# Patient Record
Sex: Female | Born: 1965 | Race: White | Hispanic: No | Marital: Married | Smoking: Never smoker
Health system: Southern US, Community
[De-identification: ages and names within clinical notes are randomized; demographics above are authoritative.]

## PROBLEM LIST (undated history)

## (undated) DIAGNOSIS — E785 Hyperlipidemia, unspecified: Secondary | ICD-10-CM

## (undated) DIAGNOSIS — Z87442 Personal history of urinary calculi: Secondary | ICD-10-CM

## (undated) DIAGNOSIS — Z87448 Personal history of other diseases of urinary system: Secondary | ICD-10-CM

## (undated) DIAGNOSIS — Z9889 Other specified postprocedural states: Secondary | ICD-10-CM

## (undated) DIAGNOSIS — E119 Type 2 diabetes mellitus without complications: Secondary | ICD-10-CM

## (undated) DIAGNOSIS — K219 Gastro-esophageal reflux disease without esophagitis: Secondary | ICD-10-CM

## (undated) DIAGNOSIS — G4730 Sleep apnea, unspecified: Secondary | ICD-10-CM

## (undated) DIAGNOSIS — I10 Essential (primary) hypertension: Secondary | ICD-10-CM

## (undated) DIAGNOSIS — N201 Calculus of ureter: Principal | ICD-10-CM

## (undated) HISTORY — DX: Type 2 diabetes mellitus without complications: E11.9

## (undated) HISTORY — PX: BACK SURGERY: SHX140

## (undated) HISTORY — DX: Personal history of urinary calculi: Z87.442

## (undated) HISTORY — DX: Essential (primary) hypertension: I10

## (undated) HISTORY — PX: BREAST BIOPSY: SHX20

## (undated) HISTORY — PX: LITHOTRIPSY: SUR834

## (undated) HISTORY — PX: CERVIX LESION DESTRUCTION: SHX591

## (undated) HISTORY — DX: Calculus of ureter: N20.1

## (undated) HISTORY — DX: Hyperlipidemia, unspecified: E78.5

## (undated) HISTORY — DX: Personal history of other diseases of urinary system: Z87.448

## (undated) HISTORY — DX: Sleep apnea, unspecified: G47.30

## (undated) HISTORY — DX: Gastro-esophageal reflux disease without esophagitis: K21.9

## (undated) HISTORY — DX: Other specified postprocedural states: Z98.89

## (undated) HISTORY — PX: DILATION AND CURETTAGE OF UTERUS: SHX78

---
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
## 2002-04-01 ENCOUNTER — Ambulatory Visit (HOSPITAL_COMMUNITY): Admission: RE | Admit: 2002-04-01 | Discharge: 2002-04-01 | Payer: Self-pay | Admitting: Obstetrics and Gynecology

## 2002-04-01 ENCOUNTER — Encounter (INDEPENDENT_AMBULATORY_CARE_PROVIDER_SITE_OTHER): Payer: Self-pay

## 2003-02-09 ENCOUNTER — Other Ambulatory Visit: Admission: RE | Admit: 2003-02-09 | Discharge: 2003-02-09 | Payer: Self-pay | Admitting: Obstetrics and Gynecology

## 2004-03-28 ENCOUNTER — Other Ambulatory Visit: Admission: RE | Admit: 2004-03-28 | Discharge: 2004-03-28 | Payer: Self-pay | Admitting: Obstetrics and Gynecology

## 2004-10-19 ENCOUNTER — Ambulatory Visit: Payer: Self-pay | Admitting: Internal Medicine

## 2004-11-27 ENCOUNTER — Ambulatory Visit: Payer: Self-pay | Admitting: Internal Medicine

## 2005-05-14 ENCOUNTER — Other Ambulatory Visit: Admission: RE | Admit: 2005-05-14 | Discharge: 2005-05-14 | Payer: Self-pay | Admitting: Obstetrics and Gynecology

## 2005-10-03 ENCOUNTER — Ambulatory Visit: Payer: Self-pay | Admitting: Internal Medicine

## 2006-06-26 ENCOUNTER — Ambulatory Visit: Payer: Self-pay | Admitting: Internal Medicine

## 2006-07-24 ENCOUNTER — Encounter: Admission: RE | Admit: 2006-07-24 | Discharge: 2006-07-24 | Payer: Self-pay | Admitting: Obstetrics and Gynecology

## 2006-08-05 ENCOUNTER — Encounter: Admission: RE | Admit: 2006-08-05 | Discharge: 2006-08-05 | Payer: Self-pay | Admitting: Obstetrics and Gynecology

## 2006-09-11 ENCOUNTER — Ambulatory Visit: Payer: Self-pay | Admitting: Cardiology

## 2006-09-11 ENCOUNTER — Ambulatory Visit: Payer: Self-pay | Admitting: Internal Medicine

## 2006-09-11 LAB — CONVERTED CEMR LAB
Crystals: NEGATIVE
Mucus, UA: NEGATIVE
Urine Glucose: NEGATIVE mg/dL
Urobilinogen, UA: 0.2 (ref 0.0–1.0)

## 2006-11-10 ENCOUNTER — Ambulatory Visit: Payer: Self-pay | Admitting: Internal Medicine

## 2007-01-08 ENCOUNTER — Ambulatory Visit: Payer: Self-pay | Admitting: Internal Medicine

## 2007-01-08 LAB — CONVERTED CEMR LAB
Specific Gravity, Urine: >=1.03 (ref 1.000–1.03)
pH: 6 (ref 5.0–8.0)

## 2007-06-01 ENCOUNTER — Encounter: Payer: Self-pay | Admitting: Internal Medicine

## 2007-06-01 ENCOUNTER — Ambulatory Visit: Payer: Self-pay | Admitting: Internal Medicine

## 2007-07-27 ENCOUNTER — Encounter: Admission: RE | Admit: 2007-07-27 | Discharge: 2007-07-27 | Payer: Self-pay | Admitting: Obstetrics and Gynecology

## 2007-07-27 ENCOUNTER — Telehealth: Payer: Self-pay | Admitting: Internal Medicine

## 2007-07-29 ENCOUNTER — Ambulatory Visit: Payer: Self-pay | Admitting: Internal Medicine

## 2007-07-29 DIAGNOSIS — J029 Acute pharyngitis, unspecified: Secondary | ICD-10-CM

## 2007-08-25 ENCOUNTER — Encounter: Payer: Self-pay | Admitting: *Deleted

## 2007-08-25 DIAGNOSIS — Z87442 Personal history of urinary calculi: Secondary | ICD-10-CM | POA: Insufficient documentation

## 2007-08-25 DIAGNOSIS — Z87448 Personal history of other diseases of urinary system: Secondary | ICD-10-CM

## 2007-08-25 DIAGNOSIS — Z9889 Other specified postprocedural states: Secondary | ICD-10-CM

## 2007-08-25 DIAGNOSIS — Z9189 Other specified personal risk factors, not elsewhere classified: Secondary | ICD-10-CM | POA: Insufficient documentation

## 2007-08-25 HISTORY — DX: Personal history of urinary calculi: Z87.442

## 2007-08-25 HISTORY — DX: Personal history of other diseases of urinary system: Z87.448

## 2007-08-25 HISTORY — DX: Other specified postprocedural states: Z98.89

## 2007-09-29 ENCOUNTER — Telehealth: Payer: Self-pay | Admitting: Internal Medicine

## 2007-11-15 IMAGING — US UNKNOWN US STUDY
1 series · 4 of 4 positions shown · non-contrast
Comparison: none

[REDACTED] LEFT
CC and MLO view(s) were taken of the left breast.

LEFT BREAST ULTRASOUND
DIGITAL LIMITED LEFT DIAGNOSTIC MAMMOGRAM AND LEFT BREAST ULTRASOUND:
CLINICAL DATA: The patient returns after screening study on 07-24-06 for evaluation of the left 
breast.

[Series 1: unknown us study · 4 of 4 slices shown]
[im 1/4]
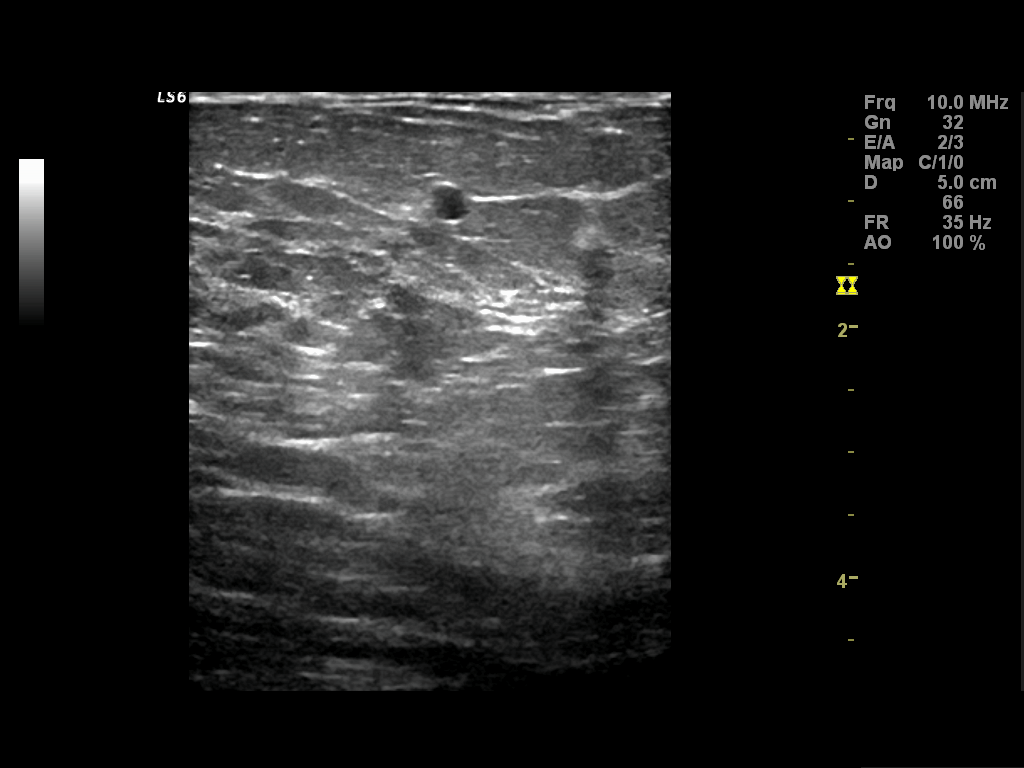
[im 2/4]
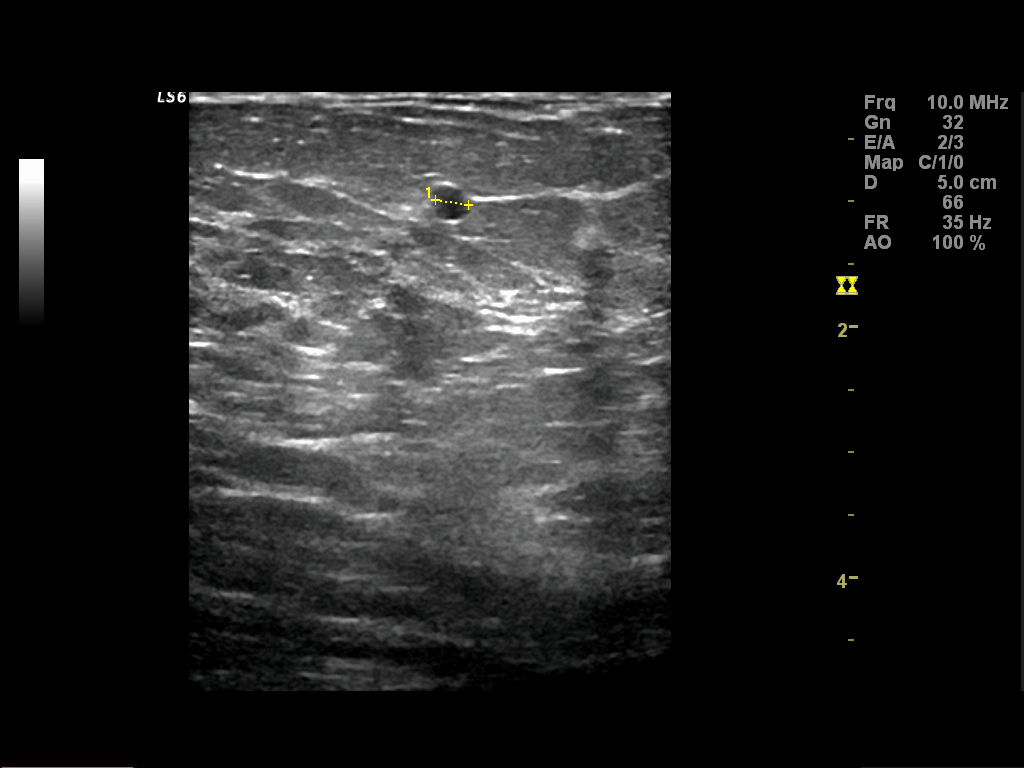
[im 3/4]
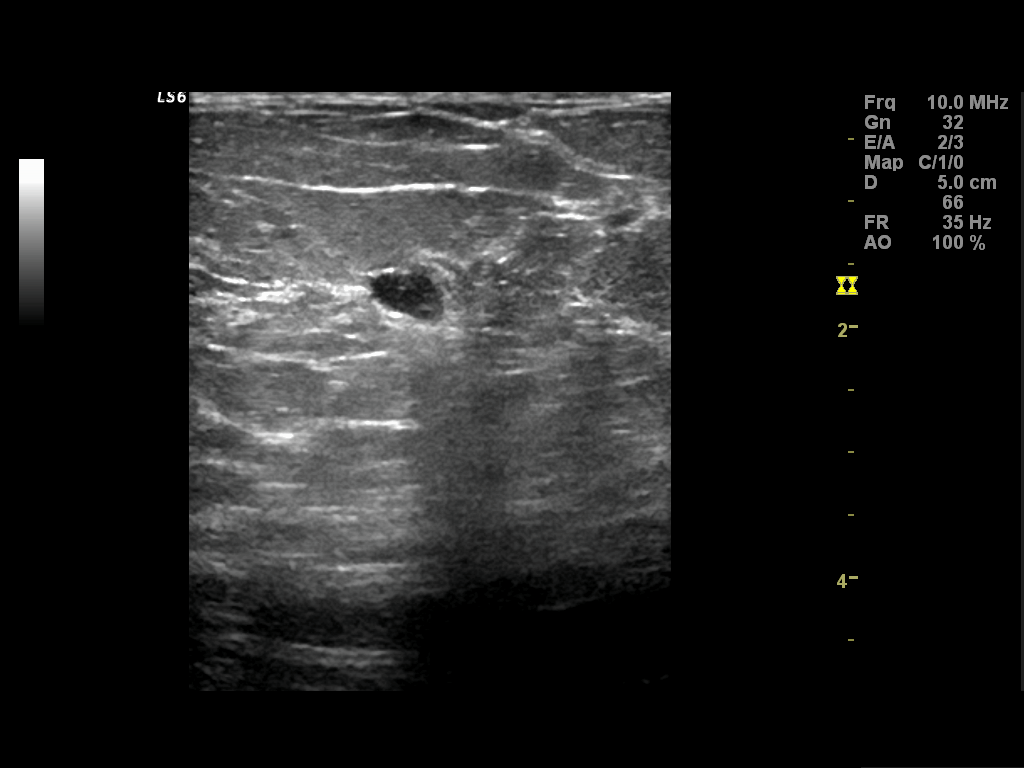
[im 4/4]
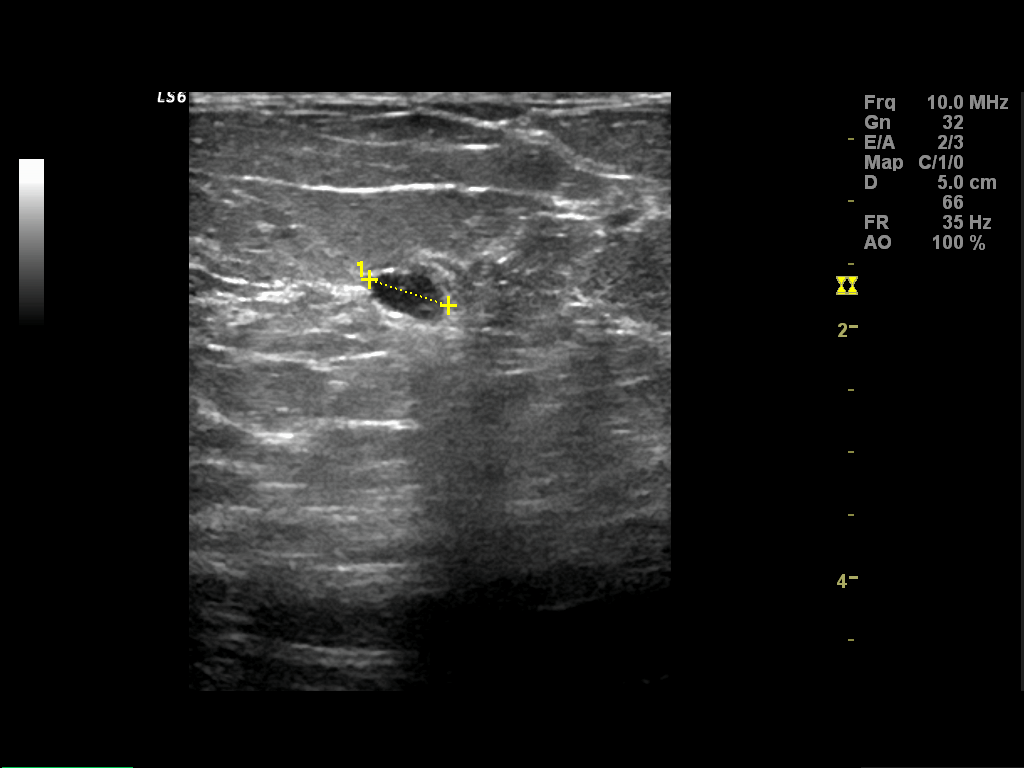

[4 of 4 positions shown; findings below may reference images not displayed]

Spot compression views of the upper portion of the left breast confirm presence of a small nodule 
which is partially obscured.  There is a moderate fibroglandular parenchymal pattern.

On physical exam, I do not palpate an abnormality in the upper quadrants of the left breast.   
Ultrasound is performed showing dense fibroglandular parenchyma.  A cyst is identified in the 12 
o'clock position of the left breast 7 cm from the nipple.  No solid mass or acoustic shadowing is 
identified to suggest the presence of malignancy.  Smaller subcentimeter cysts are also noted.
IMPRESSION: Simple cysts in the upper portion of the left breast.  No evidence for malignancy.  Annual 
mammography is recommended.

ASSESSMENT: Benign - BI-RADS 2

Screening mammogram in 1 year.
,

## 2007-12-03 ENCOUNTER — Telehealth: Payer: Self-pay | Admitting: Internal Medicine

## 2008-07-27 ENCOUNTER — Encounter: Admission: RE | Admit: 2008-07-27 | Discharge: 2008-07-27 | Payer: Self-pay | Admitting: Obstetrics and Gynecology

## 2009-05-16 ENCOUNTER — Telehealth: Payer: Self-pay | Admitting: Internal Medicine

## 2009-05-18 ENCOUNTER — Ambulatory Visit: Payer: Self-pay | Admitting: Internal Medicine

## 2009-05-18 DIAGNOSIS — R1011 Right upper quadrant pain: Secondary | ICD-10-CM

## 2009-07-31 ENCOUNTER — Encounter: Admission: RE | Admit: 2009-07-31 | Discharge: 2009-07-31 | Payer: Self-pay | Admitting: Obstetrics and Gynecology

## 2010-02-02 ENCOUNTER — Ambulatory Visit: Payer: Self-pay | Admitting: Internal Medicine

## 2010-02-02 LAB — CONVERTED CEMR LAB: Rapid Strep: NEGATIVE

## 2010-02-14 ENCOUNTER — Telehealth: Payer: Self-pay | Admitting: Internal Medicine

## 2010-08-06 ENCOUNTER — Encounter: Admission: RE | Admit: 2010-08-06 | Discharge: 2010-08-06 | Payer: Self-pay | Admitting: Obstetrics and Gynecology

## 2010-10-09 NOTE — Assessment & Plan Note (Signed)
Summary: sore throat---stc   Vital Signs:  Patient profile:   45 year old female Height:      68 inches Weight:      196 pounds BMI:     29.91 O2 Sat:      99 % on Room air Temp:     98.1 degrees F oral Pulse rate:   87 / minute BP sitting:   118 / 68  (left arm) Cuff size:   regular  Vitals Entered By: Bill Salinas CMA (Feb 02, 2010 9:38 AM)  O2 Flow:  Room air CC: pt here with c/o sore throat and head congestion x 3 days/ pt no longer taking ranitidine/ ab   Primary Care Provider:  Krizia Flight  CC:  pt here with c/o sore throat and head congestion x 3 days/ pt no longer taking ranitidine/ ab.  History of Present Illness: Patient presents for sore throat times 3 days. She has had aches, some ear congestion. She denies any fever, no N/V/D. She did see white spots on the throat this AM  Current Medications (verified): 1)  Ranitidine Hcl 150 Mg Caps (Ranitidine Hcl) .Marland Kitchen.. 1 By Mouth Two Times A Day  Allergies (verified): No Known Drug Allergies  Past History:  Past Medical History: Last updated: 08/25/2007 UTI'S, HX OF (ICD-V13.00) BREAST CANCER, FAMILY HX (ICD-V16.3) RENAL CALCULUS, HX OF (ICD-V13.01) CHICKENPOX, HX OF (ICD-V15.9) SORE THROAT (ICD-462)    Past Surgical History: Last updated: 08/25/2007 * PLACEMENT OF [place] RODS FOR SCOLIOSIS CORRECTION. 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   Review of Systems       The patient complains of fever and hoarseness.  The patient denies anorexia, weight loss, chest pain, dyspnea on exertion, prolonged cough, abdominal pain, and enlarged lymph nodes.    Physical Exam  General:  WNWD white female Head:   Normocephalic and atraumatic without obvious abnormalities. No apparent alopecia or balding. Ears:  External ear exam shows no significant lesions or deformities.  Otoscopic examination reveals clear canals, tympanic membranes are intact bilaterally without bulging, retraction, inflammation or discharge. Hearing is grossly normal bilaterally. Mouth:  tonsils with exudate and erythema Neck:  supple and full ROM.   Lungs:  normal respiratory effort and normal breath sounds.   Heart:  normal rate and regular rhythm.     Impression & Recommendations:  Problem # 1:  TONSILLITIS, ACUTE (ICD-463) tonsilits uncomplicated  Plan - augmentin 875 two times a day x 7 \         gargle of choice, APAP  Complete Medication List: 1)  Ranitidine Hcl 150 Mg Caps (Ranitidine hcl) .Marland Kitchen.. 1 by mouth two times a day 2)  Amoxicillin-pot Clavulanate 875-125 Mg Tabs (Amoxicillin-pot clavulanate) .Marland Kitchen.. 1 by mouth two times a day x 7 Prescriptions: AMOXICILLIN-POT CLAVULANATE 875-125 MG TABS (AMOXICILLIN-POT CLAVULANATE) 1 by mouth two times a day x 7  #14 x 0   Entered and Authorized by:   Jacques Navy MD   Signed by:   Bill Salinas CMA on 02/02/2010   Method used:   Electronically to        [facility]       [address]  [place], [place]  [address]       Ph: [phone]       Fax: [phone]   RxID:   [id number]   Laboratory Results   Date/Time Reported: Bill Salinas CMA  Feb 02, 2010 9:58 AM   Other Tests  Rapid Strep: negative

## 2010-10-09 NOTE — Progress Notes (Signed)
Summary: SORE THROAT  Phone Note Call from Patient Call back at Work Phone [phone] Call back at [phone]   Summary of Call: Pt continues to c/o very sore throat. She completed antibiotic. Please advise.  Initial call taken by: Lamar Sprinkles, CMA,  February 14, 2010 9:56 AM  Follow-up for Phone Call        Spoke w/pt. She completed antibiotic and white spots are gone. Throat continues to be very raw and sore, like "needles". No sinus drainage/congestion or fever. She never tried the gargling or APAP as directed at office visit. Advised pt to try gargle of choice and APAP. She will call back w/any change in symptoms.  Follow-up by: Lamar Sprinkles, CMA,  February 14, 2010 3:09 PM

## 2011-01-03 ENCOUNTER — Other Ambulatory Visit: Payer: Self-pay | Admitting: Obstetrics and Gynecology

## 2011-01-08 ENCOUNTER — Other Ambulatory Visit (INDEPENDENT_AMBULATORY_CARE_PROVIDER_SITE_OTHER): Payer: BC Managed Care – PPO

## 2011-01-08 ENCOUNTER — Ambulatory Visit (INDEPENDENT_AMBULATORY_CARE_PROVIDER_SITE_OTHER): Payer: BC Managed Care – PPO | Admitting: Internal Medicine

## 2011-01-08 ENCOUNTER — Encounter: Payer: Self-pay | Admitting: Internal Medicine

## 2011-01-08 VITALS — BP 130/80 | HR 78 | Temp 98.4°F | Ht 68.0 in | Wt 194.2 lb

## 2011-01-08 DIAGNOSIS — R109 Unspecified abdominal pain: Secondary | ICD-10-CM

## 2011-01-08 DIAGNOSIS — Z0000 Encounter for general adult medical examination without abnormal findings: Secondary | ICD-10-CM | POA: Insufficient documentation

## 2011-01-08 LAB — BASIC METABOLIC PANEL
BUN: 13 mg/dL (ref 6–23)
CO2: 28 mEq/L (ref 19–32)
Calcium: 9.2 mg/dL (ref 8.4–10.5)
Creatinine, Ser: 0.6 mg/dL (ref 0.4–1.2)
GFR: 115.6 mL/min (ref >60.00)
Glucose, Bld: 83 mg/dL (ref 70–99)
Sodium: 140 mEq/L (ref 135–145)

## 2011-01-08 LAB — CBC WITH DIFFERENTIAL/PLATELET
Eosinophils Relative: 2 % (ref 0.0–5.0)
HCT: 33.8 % — ABNORMAL LOW (ref 36.0–46.0)
Hemoglobin: 11.3 g/dL — ABNORMAL LOW (ref 12.0–15.0)
Lymphs Abs: 2.8 10*3/uL (ref 0.7–4.0)
MCV: 83.2 fl (ref 78.0–100.0)
Monocytes Absolute: 0.6 10*3/uL (ref 0.1–1.0)
Monocytes Relative: 5.4 % (ref 3.0–12.0)
Neutro Abs: 6.8 10*3/uL (ref 1.4–7.7)
Platelets: 405 10*3/uL — ABNORMAL HIGH (ref 150.0–400.0)
WBC: 10.4 10*3/uL (ref 4.5–10.5)

## 2011-01-08 LAB — URINALYSIS, ROUTINE W REFLEX MICROSCOPIC
Bilirubin Urine: NEGATIVE
Ketones, ur: NEGATIVE
Leukocytes, UA: NEGATIVE
Nitrite: NEGATIVE

## 2011-01-08 MED ORDER — CYCLOBENZAPRINE HCL 5 MG PO TABS: 5.0000 mg | ORAL_TABLET | Freq: Three times a day (TID) | ORAL | Status: DC | PRN

## 2011-01-08 MED ORDER — OXYCODONE-ACETAMINOPHEN 5-500 MG PO CAPS: 1.0000 | ORAL_CAPSULE | ORAL | Status: AC | PRN

## 2011-01-08 NOTE — Assessment & Plan Note (Signed)
By exam pt has at least moderate MSK spasm/tender which may be responsible for the pain, but pt states pain not worse with position change/urine darker  So will need UA and culture;  Currently afeb and exam benign, will hold antibx, gave limited further tylox and flexeril , but will need CT urogram and local urology eval if found to have hematuria

## 2011-01-08 NOTE — Patient Instructions (Addendum)
Take all new medications as prescribed  - the pain medication and the flexeril Continue all other medications as before Please go to LAB in the Basement for the blood and/or urine tests to be done today Please call the number on the Blue Card (the [employer] System) for results of testing in 2-3 days We will need to do CT and urology if blood shows on the UA Please see Dr Debby Bud as needed for further concerns

## 2011-01-08 NOTE — Progress Notes (Signed)
  Subjective:    Patient ID: Elizabeth Alvarado, female    DOB: 1966-07-22, 45 y.o.   MRN: [id number]  HPI  Here with left lower back and side pain onset rather sudden about 830 am, assoc with urine darker than usual,  Has some lower abd pressure, but no pain on urination, gross hematuria, urgency or frequency; pain was sharp and doubled her over this am.   No fever, chills, n/v, or LE pain/numbness/weakness.  No overt bowel change such as constipation or blood.  Pain this AM not worse or better with position change.    Has hx of 3 episodes of renal stone in the past (2 passed on her own, one by lithotrypsy), no local urologist - last seen near [place] about 10 yrs ago  Did have cervical ablation per GYN apr 26 without apparent complication;  Only took a tylox this am had from the procedure and doing ok. Father with hx of renal stone as well.   Pt denies fever, wt loss, night sweats, loss of appetite, or other constitutional symptoms  Past Medical History  Diagnosis Date  . RENAL CALCULUS, HX OF 08/25/2007  . UTI'S, HX OF 08/25/2007  . DILATION AND CURETTAGE, HX OF 08/25/2007   No past surgical history on file.  reports that she has never smoked. She does not have any smokeless tobacco history on file. Her alcohol and drug histories not on file. family history is not on file. No Known Allergies No current outpatient prescriptions on file prior to visit.   Review of Systems All otherwise neg per pt     Objective:   Physical Exam BP 130/80  Pulse 78  Temp(Src) 98.4 F (36.9 C) (Oral)  Ht 5\' 8"  (1.727 m)  Wt 194 lb 4 oz (88.111 kg)  BMI 29.54 kg/m2  SpO2 96% Physical Exam  VS noted Constitutional: Pt appears well-developed and well-nourished.  HENT: Head: Normocephalic.  Right Ear: External ear normal.  Left Ear: External ear normal.  Eyes: Conjunctivae and EOM are normal. Pupils are equal, round, and reactive to light.  Neck: Normal range of motion. Neck supple.  Cardiovascular:  Normal rate and regular rhythm.   Pulmonary/Chest: Effort normal and breath sounds normal.  Abd:  Soft, NT, non-distended, + BS Neurological: Pt is alert. No cranial nerve deficit. Motor/dtr's to LE's intact Skin: Skin is warm. No erythema.  Psychiatric: Pt behavior is normal. Thought content normal.  Does have mild to mod left flank tender and muscular spasm        Assessment & Plan:

## 2011-01-09 ENCOUNTER — Encounter: Payer: Self-pay | Admitting: Internal Medicine

## 2011-01-09 ENCOUNTER — Other Ambulatory Visit: Payer: Self-pay | Admitting: Internal Medicine

## 2011-01-09 ENCOUNTER — Ambulatory Visit (INDEPENDENT_AMBULATORY_CARE_PROVIDER_SITE_OTHER)
Admission: RE | Admit: 2011-01-09 | Discharge: 2011-01-09 | Disposition: A | Discharge status: Home / Self Care | Payer: BC Managed Care – PPO | Source: Ambulatory Visit | Attending: Cardiovascular Disease | Admitting: Cardiovascular Disease

## 2011-01-09 DIAGNOSIS — R109 Unspecified abdominal pain: Secondary | ICD-10-CM

## 2011-01-09 DIAGNOSIS — R319 Hematuria, unspecified: Secondary | ICD-10-CM

## 2011-01-09 DIAGNOSIS — N201 Calculus of ureter: Secondary | ICD-10-CM

## 2011-01-09 HISTORY — DX: Calculus of ureter: N20.1

## 2011-01-10 LAB — URINE CULTURE

## 2011-01-25 NOTE — Op Note (Signed)
[facility]  Patient:    MAILLE, HALLIWELL Visit Number: [id number] MRN: [id number]          Service Type: DSU Location: [facility] Attending Physician:  Marcelle Overlie Dictated by:   Marcelle Overlie, M.D. Proc. Date: 03/31/02 Admit Date:  04/01/2002 Discharge Date: 04/01/2002                             Operative Report  PREOPERATIVE DIAGNOSIS:       Missed abortion.  POSTOPERATIVE DIAGNOSIS:      Missed abortion.  OPERATION:                    Dilatation and evacuation.  SURGEON:                      Marcelle Overlie, M.D.  ANESTHESIA:                   MAC with paracervical block.  ESTIMATED BLOOD LOSS:         Less than 50 cc.  DESCRIPTION OF PROCEDURE:     The patient was taken to the operating room after informed consent was obtained.  She was placed in the lithotomy position.  It was noted that the patient was already bleeding after the vagina and vulva were prepped and draped in the usual sterile fashion.  In and out catheter was used to empty the bladder.  The cervix was visualized and noted to be already dilated.  A paracervical block had been performed at 5 and 7 oclock in the usual fashion.  The uterus was sounded to 8 cm.  An 8 cm suction cannula was inserted without difficulty into the uterine cavity given that the cervical os was already dilated.  Suction curettage was performed with retrieval of contents consistent with products of conception.  After the suction curettage was performed, a sharp curet was returned to the uterus, and the uterus was thoroughly curetted with very little tissue obtained with the sharp curet.  Final suction curettage was performed.  At the end of the procedure, there was no vaginal bleeding noted.  All sponge, lap, and instruments counts were correct x 2.  The patient tolerated the procedure well and went to the recovery room in stable condition.  All tissue obtained was then sent for genetic  testing. Dictated by:   Marcelle Overlie, M.D. Attending Physician:  Marcelle Overlie DD:  04/01/02 TD:  04/05/02 Job: [id number]

## 2011-02-07 ENCOUNTER — Other Ambulatory Visit: Payer: Self-pay

## 2011-02-14 ENCOUNTER — Encounter: Payer: Self-pay | Admitting: Internal Medicine

## 2011-03-11 ENCOUNTER — Other Ambulatory Visit (INDEPENDENT_AMBULATORY_CARE_PROVIDER_SITE_OTHER): Payer: BC Managed Care – PPO

## 2011-03-11 ENCOUNTER — Other Ambulatory Visit (INDEPENDENT_AMBULATORY_CARE_PROVIDER_SITE_OTHER): Payer: BC Managed Care – PPO | Admitting: Internal Medicine

## 2011-03-11 DIAGNOSIS — Z0000 Encounter for general adult medical examination without abnormal findings: Secondary | ICD-10-CM

## 2011-03-11 LAB — HEPATIC FUNCTION PANEL
ALT: 17 U/L (ref 0–35)
AST: 17 U/L (ref 0–37)
Albumin: 3.8 g/dL (ref 3.5–5.2)
Alkaline Phosphatase: 108 U/L (ref 39–117)
Total Protein: 7 g/dL (ref 6.0–8.3)

## 2011-03-11 LAB — URINALYSIS, ROUTINE W REFLEX MICROSCOPIC
Bilirubin Urine: NEGATIVE
Ketones, ur: NEGATIVE
Leukocytes, UA: NEGATIVE
Nitrite: NEGATIVE
Specific Gravity, Urine: >=1.03 (ref 1.000–1.030)
Urobilinogen, UA: 0.2 (ref 0.0–1.0)

## 2011-03-11 LAB — LIPID PANEL
Cholesterol: 174 mg/dL (ref 0–200)
Total CHOL/HDL Ratio: 4
Triglycerides: 370 mg/dL — ABNORMAL HIGH (ref 0.0–149.0)

## 2011-03-11 LAB — CBC WITH DIFFERENTIAL/PLATELET
Basophils Absolute: 0.1 10*3/uL (ref 0.0–0.1)
Eosinophils Absolute: 0.3 10*3/uL (ref 0.0–0.7)
Hemoglobin: 12.2 g/dL (ref 12.0–15.0)
Lymphocytes Relative: 24.4 % (ref 12.0–46.0)
MCHC: 33.6 g/dL (ref 30.0–36.0)
Neutro Abs: 6.1 10*3/uL (ref 1.4–7.7)
Platelets: 318 10*3/uL (ref 150.0–400.0)
RDW: 15.4 % — ABNORMAL HIGH (ref 11.5–14.6)

## 2011-03-11 LAB — BASIC METABOLIC PANEL
BUN: 14 mg/dL (ref 6–23)
Calcium: 8.6 mg/dL (ref 8.4–10.5)
Creatinine, Ser: 0.7 mg/dL (ref 0.4–1.2)
GFR: 103.48 mL/min (ref >60.00)

## 2011-03-11 LAB — TSH: TSH: 3.45 u[IU]/mL (ref 0.35–5.50)

## 2011-03-12 ENCOUNTER — Other Ambulatory Visit: Payer: Self-pay | Admitting: Internal Medicine

## 2011-03-12 DIAGNOSIS — Z0000 Encounter for general adult medical examination without abnormal findings: Secondary | ICD-10-CM

## 2011-03-18 ENCOUNTER — Encounter: Payer: Self-pay | Admitting: Internal Medicine

## 2011-03-18 ENCOUNTER — Ambulatory Visit (INDEPENDENT_AMBULATORY_CARE_PROVIDER_SITE_OTHER): Payer: BC Managed Care – PPO | Admitting: Internal Medicine

## 2011-03-18 DIAGNOSIS — Z87442 Personal history of urinary calculi: Secondary | ICD-10-CM

## 2011-03-18 DIAGNOSIS — Z0000 Encounter for general adult medical examination without abnormal findings: Secondary | ICD-10-CM

## 2011-03-18 DIAGNOSIS — M546 Pain in thoracic spine: Secondary | ICD-10-CM

## 2011-03-18 NOTE — Progress Notes (Signed)
Subjective:    Patient ID: Elizabeth Alvarado, female    DOB: 01-12-1966, 45 y.o.   MRN: [id number]  HPI Ms. Scovell presents for general physical exam. She is current with her gynecologist. Her interval history is significant for nephrolithiasis in May with passage of a 3mm stone. Otherwise she has been doing well.  She does a spot on her left brow that she is concerned about, she has a fatty tumor at the left flank. She is also have trouble with upper back pain and discomfort from her brassier strap. She does have large pendulous breasts.   Past Medical History  Diagnosis Date  . RENAL CALCULUS, HX OF 08/25/2007  . UTI'S, HX OF 08/25/2007  . DILATION AND CURETTAGE, HX OF 08/25/2007  . Ureteral stone 01/09/2011   Past Surgical History  Procedure Date  . Dilation and curettage of uterus     hx of  . Cervix lesion destruction April '12    minor abnormal PAP; "hot ballon application"   Family History  Problem Relation Age of Onset  . Cancer Mother     breast cancer, 1943  . Hypertension Mother   . Cancer Father     prostate cancer, 105  . Hyperlipidemia Father    History   Social History  . Marital Status: Married    Spouse Name: N/A    Number of Children: 1  . Years of Education: 14   Occupational History  . [employer] commercial cleaning, acctg.     [employer], [employer] acctg.   Social History Main Topics  . Smoking status: Never Smoker   . Smokeless tobacco: Never Used  . Alcohol Use: Yes     rare drink of wine or beer once a month  . Drug Use: No  . Sexually Active: Yes -- Female partner(s)   Other Topics Concern  . 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 Married '95. 1 son '01.Marriage is good. 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 Life is good      Review of Systems Review of Systems  Constitutional:  Negative for fever, chills, activity change and unexpected weight change.  HEENT:  Negative for hearing  loss, ear pain, congestion, neck stiffness and postnasal drip. Negative for sore throat or swallowing problems. Negative for dental complaints.   Eyes: Negative for vision loss or change in visual acuity.  Respiratory: Negative for chest tightness and wheezing.   Cardiovascular: Negative for chest pain and palpitation. No decreased exercise tolerance Gastrointestinal: No change in bowel habit. No bloating or gas. No reflux or indigestion Genitourinary: Negative for urgency, frequency, flank pain and difficulty urinating.  Musculoskeletal: Negative for myalgias, arthralgias and gait problem. Upper back pain and shoulder strap pain Neurological: Negative for dizziness, tremors, weakness and headaches.  Hematological: Negative for adenopathy.  Psychiatric/Behavioral: Negative for behavioral problems and dysphoric mood.       Objective:   Physical Exam Vitals reviewed Gen'l- overweight white woman in no distress HEENT- /AT, C&S clear, EACs/TMs normal, oropharynx with native dentition in good repair, posterior pharynx normal, no buccal or palatal lesions Nec - supple, no thyromegaly Chest - no cvat, no deformity Breast - deferred to Gyn but pendulous Lungs - clear with normal breath sounds Heart - regular rate and rhythm, no murmurs. 2+ radial and DP pulses Abdomen - overweight, BS +, soft, No [facility] Gyn - deferred Extremities - no deformity Neuro- A&O x 3, CN  II-XII normal, MS normal, gait normal Derm- 5 cm fatty tumor left flank at axillary line L1 level; small mole right forehead - smooth border.  Lab Results  Component Value Date   WBC 9.2 03/11/2011   HGB 12.2 03/11/2011   HCT 36.4 03/11/2011   PLT 318.0 03/11/2011   CHOL 174 03/11/2011   TRIG 370.0* 03/11/2011   HDL 44.80 03/11/2011   LDLDIRECT 94.0 03/11/2011   ALT 17 03/11/2011   AST 17 03/11/2011   NA 138 03/11/2011   K 3.8 03/11/2011   CL 107 03/11/2011   CREATININE 0.7 03/11/2011   BUN 14 03/11/2011   CO2 26 03/11/2011   TSH 3.45 03/11/2011            Assessment & Plan:

## 2011-03-19 DIAGNOSIS — M546 Pain in thoracic spine: Secondary | ICD-10-CM | POA: Insufficient documentation

## 2011-03-19 NOTE — Assessment & Plan Note (Signed)
Interval history remarkable for 4th kidney stone otherwise OK. Physical exam, sans breast and pelvic, is normal. Lab results are in normal limits except for moderately elevated triglycerides - correctable by low fat diet. She is current with her gynecologist and she has had mammography.   In summary - a very nice woman who appears to be medically stable. Should she decide to pursue reduction mammoplasty she has not surgical contra-indications.  She is advised to return for a general wellness exam in 2-3 years as long as she remains current with her gynecologist.

## 2011-03-19 NOTE — Assessment & Plan Note (Signed)
Recent episode of nephrolithiasis - 3 mm stone on CT protocol. Evidently passed spontaneously  Plan - acidify urine for prevention of calcium oxalate stones - daily lemon juice and water.

## 2011-03-19 NOTE — Assessment & Plan Note (Signed)
Patient with annoying and progressive upper back pain and pain from straps of her brassiere. She has very large pendulous breasts which are the likely cause of discomfort.  Plan - she may want to consider reduction mammoplasty for progressive back pain.

## 2011-07-03 ENCOUNTER — Other Ambulatory Visit: Payer: Self-pay | Admitting: Obstetrics and Gynecology

## 2011-07-03 DIAGNOSIS — Z1231 Encounter for screening mammogram for malignant neoplasm of breast: Secondary | ICD-10-CM

## 2011-08-08 ENCOUNTER — Ambulatory Visit
Admission: RE | Admit: 2011-08-08 | Discharge: 2011-08-08 | Disposition: A | Discharge status: Home / Self Care | Payer: BC Managed Care – PPO | Source: Ambulatory Visit | Attending: Obstetrics and Gynecology | Admitting: Obstetrics and Gynecology

## 2011-08-08 DIAGNOSIS — Z1231 Encounter for screening mammogram for malignant neoplasm of breast: Secondary | ICD-10-CM

## 2011-09-26 ENCOUNTER — Emergency Department (HOSPITAL_COMMUNITY)
Admission: EM | Admit: 2011-09-26 | Discharge: 2011-09-26 | Disposition: A | Discharge status: Home / Self Care | Payer: No Typology Code available for payment source | Attending: Emergency Medicine | Admitting: Emergency Medicine

## 2011-09-26 ENCOUNTER — Encounter (HOSPITAL_COMMUNITY): Payer: Self-pay | Admitting: *Deleted

## 2011-09-26 DIAGNOSIS — S0191XA Laceration without foreign body of unspecified part of head, initial encounter: Secondary | ICD-10-CM

## 2011-09-26 DIAGNOSIS — W540XXA Bitten by dog, initial encounter: Secondary | ICD-10-CM | POA: Insufficient documentation

## 2011-09-26 DIAGNOSIS — S0100XA Unspecified open wound of scalp, initial encounter: Secondary | ICD-10-CM | POA: Insufficient documentation

## 2011-09-26 MED ORDER — AMOXICILLIN-POT CLAVULANATE 875-125 MG PO TABS: 1.0000 | ORAL_TABLET | Freq: Two times a day (BID) | ORAL | Status: AC

## 2011-09-26 MED ORDER — TETANUS-DIPHTH-ACELL PERTUSSIS 5-2.5-18.5 LF-MCG/0.5 IM SUSP
0.5000 mL | Freq: Once | INTRAMUSCULAR | Status: AC
  Administered 2011-09-26: 0.5 mL via INTRAMUSCULAR
  Filled 2011-09-26: qty 0.5

## 2011-09-26 MED ORDER — TRAMADOL HCL 50 MG PO TABS: 50.0000 mg | ORAL_TABLET | Freq: Four times a day (QID) | ORAL | Status: AC | PRN

## 2011-09-26 MED ORDER — TRAMADOL HCL 50 MG PO TABS: 50.0000 mg | ORAL_TABLET | Freq: Four times a day (QID) | ORAL | Status: DC | PRN

## 2011-09-26 NOTE — ED Notes (Signed)
Pt in good condition, in no acute stress, wound dressed

## 2011-09-26 NOTE — ED Provider Notes (Signed)
History     CSN: [id number]  Arrival date & time 09/26/11  2025   First MD Initiated Contact with Patient 09/26/11 2117      Chief Complaint  Patient presents with  . Animal Bite    family dog, h/o same    (Consider location/radiation/quality/duration/timing/severity/associated sxs/prior treatment) Patient is a 46 y.o. female presenting with animal bite. The history is provided by the patient.  Animal Bite  The incident occurred just prior to arrival. There is an injury to the face. The pain is mild. It is unlikely that a foreign body is present. Pertinent negatives include no visual disturbance, no nausea, no vomiting, no headaches, no neck pain, no focal weakness, no light-headedness, no loss of consciousness, no tingling and no weakness.    Past Medical History  Diagnosis Date  . RENAL CALCULUS, HX OF 08/25/2007  . UTI'S, HX OF 08/25/2007  . DILATION AND CURETTAGE, HX OF 08/25/2007  . Ureteral stone 01/09/2011    Past Surgical History  Procedure Date  . Dilation and curettage of uterus     hx of  . Cervix lesion destruction April '12    minor abnormal PAP; "hot ballon application"  . Back surgery   . Lithotripsy     Family History  Problem Relation Age of Onset  . Cancer Mother     breast cancer, 1943  . Hypertension Mother   . Cancer Father     prostate cancer, 51  . Hyperlipidemia Father     History  Substance Use Topics  . Smoking status: Never Smoker   . Smokeless tobacco: Never Used  . Alcohol Use: Yes     rare drink of wine or beer once a month    OB History    Grav Para Term Preterm Abortions TAB SAB Ect Mult Living                  Review of Systems  HENT: Negative for neck pain and neck stiffness.   Eyes: Negative for pain and visual disturbance.  Gastrointestinal: Negative for nausea and vomiting.  Skin: Positive for wound.  Neurological: Negative for tingling, focal weakness, loss of consciousness, weakness, light-headedness and  headaches.    Allergies  Review of patient's allergies indicates no known allergies.  Home Medications   Current Outpatient Rx  Name Route Sig Dispense Refill  . AMOXICILLIN-POT CLAVULANATE 875-125 MG PO TABS Oral Take 1 tablet by mouth every 12 (twelve) hours. 14 tablet 0  . TRAMADOL HCL 50 MG PO TABS Oral Take 1 tablet (50 mg total) by mouth every 6 (six) hours as needed for pain. 15 tablet 0    BP 144/93  Temp 98 F (36.7 C)  Resp 18  Wt 155 lb (70.308 kg)  SpO2 100%  LMP 09/17/2011  Physical Exam  Constitutional: She is oriented to person, place, and time. She appears well-developed and well-nourished. No distress.  HENT:  Head: Normocephalic.       2 lacerations ( 2cm and 3 cm ) to L temporal region extending to subcutaneous. Minimal oozing. Eye unaffected. No evidence of trauma, PERRL, EOMI  Eyes: Conjunctivae and EOM are normal. Pupils are equal, round, and reactive to light.  Neck: Normal range of motion. Neck supple.       No cervical midline tenderness  Cardiovascular: Normal rate and regular rhythm.   Pulmonary/Chest: Effort normal and breath sounds normal. No respiratory distress. She has no wheezes. She has no rales. She exhibits no tenderness.  Abdominal: Soft. Bowel sounds are normal. She exhibits no mass. There is no tenderness. There is no rebound.  Musculoskeletal: Normal range of motion.  Neurological: She is alert and oriented to person, place, and time.  Skin: No rash noted. No erythema. No pallor.    ED Course  Procedures (including critical care time)  Labs Reviewed - No data to display No results found.   1. Dog bite of face   2. Laceration of face, complex    LACERATION REPAIR Performed by: Loren Racer Authorized by: Ranae Palms, Amro Winebarger Consent: Verbal consent obtained. Risks and benefits: risks, benefits and alternatives were discussed Consent given by: patient Patient identity confirmed: provided demographic data Prepped and Draped  in normal sterile fashion Wound explored  Laceration Location: L temporal region Laceration Length: 5 cm  No Foreign Bodies seen or palpated  Anesthesia: local infiltration  Local anesthetic: lidocaine 2% with epinephrine  Anesthetic total: 5 ml  Irrigation method: syringe Amount of cleaning: standard  Skin closure: complex  Number of sutures: 5-0 Vicryl (2), 6-0 nylon(8)  Technique: 2 dermal sutures placed to approximate skin edges and then closed with simple interrupted sutures   Patient tolerance: Patient tolerated the procedure well with no immediate complications.   MDM  Good cosmetic outcome. F/u with PMD or in ED to have sutures removed in 5 days.         Loren Racer, MD 09/26/11 2242

## 2011-09-26 NOTE — ED Notes (Signed)
Patient with laceration at lateral corner of left eye.  Laceration is approx 1 in long, bleeding controlled at this time.

## 2011-09-26 NOTE — ED Notes (Signed)
Pt here s/p family dog bite, bite to L lateral eye. Bleeding controlled, redness & swelling present, no obvious bruising. Pt tearful. H/o dog bite with same dog & other member of family. "dog's shots are UTD", pt's Td unknown, >5 yrs (not sure). Rates pain 6/10. Dizzy initially, denies dizziness or nausea at this time, denies other sx. Bite/wound ~ 1".

## 2012-01-30 ENCOUNTER — Other Ambulatory Visit: Payer: Self-pay | Admitting: Obstetrics and Gynecology

## 2012-03-18 ENCOUNTER — Encounter: Payer: BC Managed Care – PPO | Admitting: Internal Medicine

## 2012-05-20 ENCOUNTER — Other Ambulatory Visit (INDEPENDENT_AMBULATORY_CARE_PROVIDER_SITE_OTHER): Payer: No Typology Code available for payment source

## 2012-05-20 ENCOUNTER — Ambulatory Visit (INDEPENDENT_AMBULATORY_CARE_PROVIDER_SITE_OTHER): Payer: No Typology Code available for payment source | Admitting: Internal Medicine

## 2012-05-20 ENCOUNTER — Encounter: Payer: Self-pay | Admitting: Internal Medicine

## 2012-05-20 VITALS — BP 124/84 | HR 90 | Temp 98.0°F | Resp 16 | Wt 173.0 lb

## 2012-05-20 DIAGNOSIS — Z23 Encounter for immunization: Secondary | ICD-10-CM

## 2012-05-20 DIAGNOSIS — E7849 Other hyperlipidemia: Secondary | ICD-10-CM | POA: Insufficient documentation

## 2012-05-20 DIAGNOSIS — E1169 Type 2 diabetes mellitus with other specified complication: Secondary | ICD-10-CM | POA: Insufficient documentation

## 2012-05-20 DIAGNOSIS — Z87448 Personal history of other diseases of urinary system: Secondary | ICD-10-CM

## 2012-05-20 DIAGNOSIS — E781 Pure hyperglyceridemia: Secondary | ICD-10-CM

## 2012-05-20 DIAGNOSIS — Z0000 Encounter for general adult medical examination without abnormal findings: Secondary | ICD-10-CM

## 2012-05-20 NOTE — Progress Notes (Signed)
Subjective:     Patient ID: Elizabeth Alvarado, female   DOB: Jul 22, 1966, 46 y.o.   MRN: [id number]  HPI Comments: Elizabeth Alvarado is a 46 yo female with a history of scoliosis who has come to clinic today for an annual exam. She reports no changes in her health or illness in the last year. She exercises three times a week at a gym. She reports that she tans and bikes outside. She wears SPF 15 sunscreen that protects against UVA and UVB rays and wears a hat. She has stopped drinking soft drinks and now drinks mostly unsweet tea and water. She reports that she has strayed from her diet and gained weight. She sees a gynecologist regularly for health maintenance. She has no complaints at this time.  Past Medical History  Diagnosis Date  . RENAL CALCULUS, HX OF 08/25/2007  . UTI'S, HX OF 08/25/2007  . DILATION AND CURETTAGE, HX OF 08/25/2007  . Ureteral stone 01/09/2011   Past Surgical History  Procedure Date  . Dilation and curettage of uterus     hx of  . Cervix lesion destruction April '12    minor abnormal PAP; "hot ballon application"  . Back surgery   . Lithotripsy    Family History  Problem Relation Age of Onset  . Cancer Mother     breast cancer, 1943  . Hypertension Mother   . Cancer Father     prostate cancer, 38  . Hyperlipidemia Father    History   Social History  . Marital Status: Married    Spouse Name: N/A    Number of Children: 1  . Years of Education: 14   Occupational History  . [employer], ac[employer]g.     HS[employer], [employer].   Social History Main Topics  . Smoking status: Never Smoker   . Smokeless tobacco: Never Used  . Alcohol Use: 1.0 oz/week    2 drink(s) per week     rare drink of wine or beer once a month  . Drug Use: No  . Sexually Active: Yes -- Female partner(s)     Monogamous relationship with husband.   Other Topics Concern  . 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 Married '95. 1  son '01.Marriage is good. 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 Life is good    No current outpatient prescriptions on file prior to visit.    Review of Systems  Constitutional: Negative for fever, chills, appetite change, fatigue and unexpected weight change.  HENT: Negative for hearing loss, congestion, facial swelling, trouble swallowing and tinnitus.   Eyes: Negative for photophobia and visual disturbance.  Respiratory: Negative for chest tightness, shortness of breath and wheezing.   Cardiovascular: Negative for chest pain, palpitations and leg swelling.  Gastrointestinal: Negative for abdominal pain and constipation.  Genitourinary: Negative for dysuria, frequency, decreased urine volume and difficulty urinating.  Musculoskeletal: Negative for back pain and joint swelling.  Neurological: Negative for weakness, numbness and headaches.       Objective:   Physical Exam  Constitutional: She is oriented to person, place, and time. She appears well-developed and well-nourished.       Obese  HENT:  Head: Normocephalic and atraumatic.  Right Ear: External ear normal.  Left Ear: External ear normal.  Nose: Nose normal.  Mouth/Throat: Oropharynx is clear and moist. No oropharyngeal exudate.       TM visualized in both  ears and normal. External canals clear of debris.   Eyes: Conjunctivae normal are normal. Pupils are equal, round, and reactive to light. Right eye exhibits no discharge. Left eye exhibits no discharge. No scleral icterus.  Neck: Normal range of motion. Neck supple. No thyromegaly present.  Cardiovascular: Normal rate, regular rhythm, normal heart sounds and intact distal pulses.  Exam reveals no gallop and no friction rub.   No murmur heard.      No carotid bruits auscultated. 2+ Carotid pulses bl. 2+ Radial pulses bl. 1+ dorsal pedalis pulses bl.  Pulmonary/Chest: Breath sounds normal. No respiratory distress. She has no wheezes. She has no rales.       Equal  chest expansion  Abdominal: Soft. Bowel sounds are normal. She exhibits no distension and no mass. There is no tenderness (no tenderness to light or deep palpation). There is no guarding.       Lower liver boarder at right costal margin. No abdominal bruits auscultated.  Musculoskeletal: She exhibits no edema and no tenderness.  Lymphadenopathy:    She has cervical adenopathy (remote swollen lymph node on left side of neck.).  Neurological: She is alert and oriented to person, place, and time. She exhibits normal muscle tone.  Skin: Skin is warm.  Psychiatric: She has a normal mood and affect. Her behavior is normal. Judgment and thought content normal.   Filed Vitals:   05/20/12 1428  BP: 124/84  Pulse: 90  Temp: 98 F (36.7 C)  Resp: 16       Assessment & Plan:     1. Annual Exam - Patient is in good health with no concerns. Triglycerides were elevated 1 year ago. Patient will schedule her own mammography with an established clinic. She had a normal PAP smear this year.  PLAN - Obtain triglyceride lab. No interventions needed at this time.     Attending note: patient interviewed and a limited exam performed. Agree with the assessment and plan of Mr. Kai Levins, MS III.

## 2012-05-20 NOTE — Assessment & Plan Note (Signed)
Patient reports that she has not had any UTIs in the last year.  PLAN - Nothing to do. Inquire about UTI on next exam.

## 2012-05-22 ENCOUNTER — Encounter: Payer: Self-pay | Admitting: Internal Medicine

## 2012-05-22 ENCOUNTER — Other Ambulatory Visit: Payer: Self-pay | Admitting: Internal Medicine

## 2012-05-22 DIAGNOSIS — E781 Pure hyperglyceridemia: Secondary | ICD-10-CM

## 2012-05-23 NOTE — Assessment & Plan Note (Signed)
Interval medical history is benign. Physical exam, sans breast and pelvic, is normal. She is current with her gynecologist for cervical and breast cancer screening. Tetanus immunization is up to date. She does exercise on a regular basis.  In summary - a nice woman who is medically stable and doing well. She is advised to return for a routine medical exam in 2-3 years. She should continue to see her gynecologist as instructed.

## 2012-05-23 NOTE — Assessment & Plan Note (Signed)
July '12 Sept '13 Triglycerides   370   259  No need for medical intervention. Low fat, low carb diet recommended.

## 2012-06-29 ENCOUNTER — Other Ambulatory Visit: Payer: Self-pay | Admitting: Obstetrics and Gynecology

## 2012-06-29 DIAGNOSIS — Z1231 Encounter for screening mammogram for malignant neoplasm of breast: Secondary | ICD-10-CM

## 2012-08-10 ENCOUNTER — Ambulatory Visit: Payer: No Typology Code available for payment source

## 2012-08-17 ENCOUNTER — Ambulatory Visit
Admission: RE | Admit: 2012-08-17 | Discharge: 2012-08-17 | Disposition: A | Discharge status: Home / Self Care | Payer: No Typology Code available for payment source | Source: Ambulatory Visit | Attending: Obstetrics and Gynecology | Admitting: Obstetrics and Gynecology

## 2012-08-17 DIAGNOSIS — Z1231 Encounter for screening mammogram for malignant neoplasm of breast: Secondary | ICD-10-CM

## 2012-08-20 ENCOUNTER — Other Ambulatory Visit: Payer: Self-pay | Admitting: Obstetrics and Gynecology

## 2012-08-20 DIAGNOSIS — R928 Other abnormal and inconclusive findings on diagnostic imaging of breast: Secondary | ICD-10-CM

## 2012-09-03 ENCOUNTER — Ambulatory Visit
Admission: RE | Admit: 2012-09-03 | Discharge: 2012-09-03 | Disposition: A | Discharge status: Home / Self Care | Payer: No Typology Code available for payment source | Source: Ambulatory Visit | Attending: Obstetrics and Gynecology | Admitting: Obstetrics and Gynecology

## 2012-09-03 ENCOUNTER — Other Ambulatory Visit: Payer: Self-pay | Admitting: Obstetrics and Gynecology

## 2012-09-03 DIAGNOSIS — R928 Other abnormal and inconclusive findings on diagnostic imaging of breast: Secondary | ICD-10-CM

## 2012-09-14 ENCOUNTER — Ambulatory Visit
Admission: RE | Admit: 2012-09-14 | Discharge: 2012-09-14 | Disposition: A | Discharge status: Home / Self Care | Payer: No Typology Code available for payment source | Source: Ambulatory Visit | Attending: Obstetrics and Gynecology | Admitting: Obstetrics and Gynecology

## 2012-09-14 ENCOUNTER — Ambulatory Visit
Admission: RE | Admit: 2012-09-14 | Discharge: 2012-09-14 | Disposition: A | Discharge status: Home / Self Care | Payer: No Typology Code available for payment source | Source: Ambulatory Visit

## 2012-09-14 ENCOUNTER — Other Ambulatory Visit: Payer: Self-pay | Admitting: Obstetrics and Gynecology

## 2012-09-14 DIAGNOSIS — R928 Other abnormal and inconclusive findings on diagnostic imaging of breast: Secondary | ICD-10-CM

## 2012-09-15 ENCOUNTER — Other Ambulatory Visit: Payer: Self-pay | Admitting: Obstetrics and Gynecology

## 2012-09-15 ENCOUNTER — Inpatient Hospital Stay: Admission: RE | Admit: 2012-09-15 | Payer: No Typology Code available for payment source | Source: Ambulatory Visit

## 2012-09-15 DIAGNOSIS — N63 Unspecified lump in unspecified breast: Secondary | ICD-10-CM

## 2012-09-16 ENCOUNTER — Other Ambulatory Visit: Payer: Self-pay | Admitting: Obstetrics and Gynecology

## 2012-09-16 DIAGNOSIS — R928 Other abnormal and inconclusive findings on diagnostic imaging of breast: Secondary | ICD-10-CM

## 2013-01-05 ENCOUNTER — Telehealth: Payer: Self-pay | Admitting: Internal Medicine

## 2013-01-05 NOTE — Telephone Encounter (Signed)
Patient Information:  Caller Name: Zafiro  Phone: [phone]  Patient: Larina Bras  Gender: Female  DOB: Jan 09, 1966  Age: 47 Years  PCP: Illene Regulus (Adults only)  Pregnant: No  Office Follow Up:  Does the office need to follow up with this patient?: Yes  Instructions For The Office: Patient will look at her schedule and call back if she can for an appt.   Declined appt at this time   Symptoms  Reason For Call & Symptoms: "I think I have UTI", requesting antibiotic. Onset this morning, +burning, +urgency and pressure. No blood in urine , no odor.  Last UOP- ago.  Reviewed Health History In EMR: Yes  Reviewed Medications In EMR: Yes  Reviewed Allergies In EMR: Yes  Reviewed Surgeries / Procedures: Yes  Date of Onset of Symptoms: 01/05/2013  Treatments Tried: water, cranberry supplements, Azo  Treatments Tried Worked: No OB / GYN:  LMP: 12/12/2012  Guideline(s) Used:  Urination Pain - Female  Disposition Per Guideline:   Go to Office Now  Reason For Disposition Reached:   Side (flank) or lower back pain present  Advice Given:  Fluids:   Drink extra fluids. Drink 8-10 glasses of liquids a day (Reason: to produce a dilute, non-irritating urine).  Warm Saline SITZ Baths to Reduce Pain:  Sit in a warm saline bath for 20 minutes to cleanse the area and to reduce pain. Add 2 oz. of table salt or baking soda to a tub of water.  Call Back If:  You become worse.  Patient Refused Recommendation:  Patient Requests Prescription  Patient will look at her schedule and call back if she can for an appt.   Declined appt at this time

## 2013-03-10 ENCOUNTER — Other Ambulatory Visit: Payer: Self-pay | Admitting: Obstetrics and Gynecology

## 2013-03-30 ENCOUNTER — Other Ambulatory Visit: Payer: Self-pay | Admitting: Obstetrics and Gynecology

## 2013-08-09 ENCOUNTER — Other Ambulatory Visit: Payer: Self-pay

## 2013-08-09 DIAGNOSIS — Z1231 Encounter for screening mammogram for malignant neoplasm of breast: Secondary | ICD-10-CM

## 2013-08-30 ENCOUNTER — Other Ambulatory Visit: Payer: Self-pay | Admitting: Obstetrics and Gynecology

## 2013-09-15 ENCOUNTER — Ambulatory Visit
Admission: RE | Admit: 2013-09-15 | Discharge: 2013-09-15 | Disposition: A | Discharge status: Home / Self Care | Payer: BC Managed Care – PPO | Source: Ambulatory Visit

## 2013-09-15 DIAGNOSIS — Z1231 Encounter for screening mammogram for malignant neoplasm of breast: Secondary | ICD-10-CM

## 2014-02-28 ENCOUNTER — Telehealth: Payer: Self-pay | Admitting: Internal Medicine

## 2014-02-28 NOTE — Telephone Encounter (Signed)
Patient aware that she will need to go to Urgent care in order to get any RX medications at this time.  I tried to offer OV for tomorrow but our schedule is booked.  Patient understood and will go to urgent care.

## 2014-02-28 NOTE — Telephone Encounter (Signed)
Patient Information:  Caller Name: [name]  Phone: [phone]  Patient: Elizabeth Alvarado, Elizabeth Alvarado  Gender: Female  DOB: 07/08/1966  Age: 48 Years  PCP: Maximino SarinWeaver, Layne  Pregnant: No  Office Follow Up:  Does the office need to follow up with this patient?: Yes  Instructions For The Office: She is at work and will go to an Urgent Care if office will not call her in medication for Poision Lajoyce Cornersvy that is not responding to any OTC treatment. Her Dr. that just retired from [facility] office used to call her in medications, she has not been seen in this office.  RN Note:  Afebrile. After working in the yard, poison ivy contact and widespread, red, itchy rash: legs, arms, stomach, back between legs and not responding to any home care treatments mentioned above. She was a patient of Christella NoaMichael Norris, who is now retired as of May 2015 and would like prescription called in, if not will be seen in the office. She prefers to use the Pharmacy: [facility] [address] [place] [phone]. Please call today, 02/28/2014 and let her know if something will be called in or if she needs to go to an Urgent Care.  Symptoms  Reason For Call & Symptoms: Working in the yard and got poision ivy" widespread  Reviewed Health History In EMR: Yes  Reviewed Medications In EMR: Yes  Reviewed Allergies In EMR: Yes  Reviewed Surgeries / Procedures: Yes  Date of Onset of Symptoms: 02/14/2014  Treatments Tried: Otc: Calamine lotion, Vinegar, Bleach, OH, Benadryl  Treatments Tried Worked: No OB / GYN:  LMP: 02/28/2014  Guideline(s) Used:  Poison Ivy - Oak or [place]  Disposition Per Guideline:   See Today in Office  Reason For Disposition Reached:   Severe itching interferes with normal activities (e.Alvarado., work or school) or prevents sleep  Advice Given:  Apply Cold to the Area:  Soak the involved area in cool water for 20 minutes or massage it with an ice cube as often as necessary to reduce itching and oozing.  Oral  Antihistamine Medication for Itching:   Do not take antihistamine medications if you have prostate problems.  Call Back If:  It looks infected  You become worse.  Patient Will Follow Care Advice:  YES

## 2014-05-23 ENCOUNTER — Other Ambulatory Visit: Payer: Self-pay | Admitting: Obstetrics and Gynecology

## 2014-05-24 LAB — CYTOLOGY - PAP

## 2014-05-30 ENCOUNTER — Ambulatory Visit (INDEPENDENT_AMBULATORY_CARE_PROVIDER_SITE_OTHER): Payer: BC Managed Care – PPO | Admitting: Family Medicine

## 2014-05-30 ENCOUNTER — Encounter: Payer: Self-pay | Admitting: Family Medicine

## 2014-05-30 VITALS — BP 124/82 | HR 83 | Temp 98.4°F | Ht 67.0 in | Wt 198.5 lb

## 2014-05-30 DIAGNOSIS — R600 Localized edema: Secondary | ICD-10-CM

## 2014-05-30 DIAGNOSIS — R609 Edema, unspecified: Secondary | ICD-10-CM

## 2014-05-30 DIAGNOSIS — A46 Erysipelas: Secondary | ICD-10-CM

## 2014-05-30 MED ORDER — DOXYCYCLINE HYCLATE 100 MG PO TABS: 100.0000 mg | ORAL_TABLET | Freq: Two times a day (BID) | ORAL | Status: DC

## 2014-05-30 MED ORDER — AMOXICILLIN 875 MG PO TABS: 875.0000 mg | ORAL_TABLET | Freq: Two times a day (BID) | ORAL | Status: DC

## 2014-05-30 NOTE — Patient Instructions (Signed)
-  As we discussed, we have prescribed a new medication for you at this appointment. We discussed the common and serious potential adverse effects of this medication and you can review these and more with the pharmacist when you pick up your medication.  Please follow the instructions for use carefully and notify us immediately if you have any problems taking this medication.  -elevate feet for 30 minutes twice daily

## 2014-05-30 NOTE — Progress Notes (Signed)
No chief complaint on file.   HPI:  Acute visit for:  1) Feet swollen: -started today -went to the race in [place] yesterday and ate biscuits ,then lunch at the track -denies: CP, SOB, DOE, Orthopnea, fevers, chills, malaise, palpitations -has been on her feet more then usually  -PCP Dr. Myrtie Hawk, but has not seen yet  ROS: See pertinent positives and negatives per HPI.  Past Medical History  Diagnosis Date  . RENAL CALCULUS, HX OF 08/25/2007  . UTI'S, HX OF 08/25/2007  . DILATION AND CURETTAGE, HX OF 08/25/2007  . Ureteral stone 01/09/2011    Past Surgical History  Procedure Laterality Date  . Dilation and curettage of uterus      hx of  . Cervix lesion destruction  April '12    minor abnormal PAP; "hot ballon application"  . Back surgery    . Lithotripsy      Family History  Problem Relation Age of Onset  . Cancer Mother     breast cancer, 1943  . Hypertension Mother   . Cancer Father     prostate cancer, 71  . Hyperlipidemia Father     History   Social History  . Marital Status: Married    Spouse Name: N/A    Number of Children: 1  . Years of Education: 14   Occupational History  . [employer], ac[employer]g.     HS[employer], [employer].   Social History Main Topics  . Smoking status: Never Smoker   . Smokeless tobacco: Never Used  . Alcohol Use: 1.0 oz/week    2 drink(s) per week     Comment: rare drink of wine or beer once a month  . Drug Use: No  . Sexual Activity: Yes    Partners: Male     Comment: Monogamous relationship with husband.   Other Topics Concern  . 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  GENERAL: vitals reviewed and listed above, alert, oriented, appears well hydrated and in no acute distress  HEENT: atraumatic, conjunttiva clear, no obvious abnormalities on inspection of external nose and ears  NECK: no obvious masses on inspection  LUNGS: clear to auscultation bilaterally, no wheezes, rales or rhonchi, good air movement  CV: HRRR, tr pitting edema in bilat lower extremities  MS: moves all extremities without noticeable abnormality  SKIN: red-brown small macules and dry skin on LE bilat, mild erythema R ant LE  PSYCH: pleasant and cooperative, no obvious depression or anxiety  ASSESSMENT AND PLAN:  Discussed the following assessment and plan:  Bilateral edema of lower extremity  Erysipelas - Plan: doxycycline (VIBRA-TABS) 100 MG tablet, amoxicillin (AMOXIL) 875 MG tablet  -looks like she may have mild chronic venous insufficiency with developing erysipelas per findings -tx with abx and elevation with close follow up - risks and other potential etiologies disucssed -Patient  advised to return or notify a doctor immediately if symptoms worsen or persist or new concerns arise.  Patient Instructions  -As we discussed, we have prescribed a new medication for you at this appointment. We discussed the common and serious potential adverse effects of this medication and you can review these and more with the pharmacist when you pick up your medication.  Please follow the instructions for use carefully and notify us immediately if you have any problems taking this medication.  -elevate feet for 30 minutes twice daily      Elizabeth Alvarado R.

## 2014-05-30 NOTE — Progress Notes (Signed)
Pre visit review using our clinic review tool, if applicable. No additional management support is needed unless otherwise documented below in the visit note. 

## 2014-06-02 ENCOUNTER — Encounter: Payer: Self-pay | Admitting: Nurse Practitioner

## 2014-06-02 ENCOUNTER — Telehealth: Payer: Self-pay | Admitting: Nurse Practitioner

## 2014-06-02 ENCOUNTER — Other Ambulatory Visit (INDEPENDENT_AMBULATORY_CARE_PROVIDER_SITE_OTHER): Payer: BC Managed Care – PPO

## 2014-06-02 ENCOUNTER — Ambulatory Visit (INDEPENDENT_AMBULATORY_CARE_PROVIDER_SITE_OTHER): Payer: BC Managed Care – PPO | Admitting: Nurse Practitioner

## 2014-06-02 VITALS — BP 158/96 | HR 79 | Temp 98.4°F | Ht 67.0 in | Wt 197.0 lb

## 2014-06-02 DIAGNOSIS — R609 Edema, unspecified: Secondary | ICD-10-CM

## 2014-06-02 DIAGNOSIS — I10 Essential (primary) hypertension: Secondary | ICD-10-CM

## 2014-06-02 DIAGNOSIS — R635 Abnormal weight gain: Secondary | ICD-10-CM

## 2014-06-02 LAB — CBC WITH DIFFERENTIAL/PLATELET
BASOS ABS: 0 10*3/uL (ref 0.0–0.1)
Basophils Relative: 0.5 % (ref 0.0–3.0)
EOS PCT: 4.8 % (ref 0.0–5.0)
Eosinophils Absolute: 0.4 10*3/uL (ref 0.0–0.7)
HEMATOCRIT: 39.4 % (ref 36.0–46.0)
HEMOGLOBIN: 13.3 g/dL (ref 12.0–15.0)
LYMPHS PCT: 24.3 % (ref 12.0–46.0)
Lymphs Abs: 1.8 10*3/uL (ref 0.7–4.0)
MCHC: 33.7 g/dL (ref 30.0–36.0)
MCV: 89.9 fl (ref 78.0–100.0)
MONOS PCT: 6.4 % (ref 3.0–12.0)
Monocytes Absolute: 0.5 10*3/uL (ref 0.1–1.0)
NEUTROS ABS: 4.6 10*3/uL (ref 1.4–7.7)
Neutrophils Relative %: 64 % (ref 43.0–77.0)
Platelets: 330 10*3/uL (ref 150.0–400.0)
RBC: 4.38 Mil/uL (ref 3.87–5.11)
RDW: 13.6 % (ref 11.5–15.5)
WBC: 7.2 10*3/uL (ref 4.0–10.5)

## 2014-06-02 LAB — HEPATIC FUNCTION PANEL
ALBUMIN: 3.9 g/dL (ref 3.5–5.2)
ALK PHOS: 107 U/L (ref 39–117)
ALT: 17 U/L (ref 0–35)
AST: 18 U/L (ref 0–37)
BILIRUBIN DIRECT: 0.1 mg/dL (ref 0.0–0.3)
TOTAL PROTEIN: 7.3 g/dL (ref 6.0–8.3)
Total Bilirubin: 0.4 mg/dL (ref 0.2–1.2)

## 2014-06-02 LAB — BASIC METABOLIC PANEL
BUN: 12 mg/dL (ref 6–23)
CHLORIDE: 104 meq/L (ref 96–112)
CO2: 25 meq/L (ref 19–32)
CREATININE: 0.7 mg/dL (ref 0.4–1.2)
Calcium: 9.1 mg/dL (ref 8.4–10.5)
GFR: 95.3 mL/min (ref >60.00)
GLUCOSE: 90 mg/dL (ref 70–99)
Potassium: 4.4 mEq/L (ref 3.5–5.1)
SODIUM: 137 meq/L (ref 135–145)

## 2014-06-02 NOTE — Telephone Encounter (Signed)
Patient verbalizes understanding of instructions. See phone note.

## 2014-06-02 NOTE — Patient Instructions (Signed)
Get labs checked now Keep legs elevated as much as possible.  Follow up in 1 week for re evaluation  Call clinic if symptoms worsen or not resolved. Begin weight reduction diet.    Edema Edema is an abnormal buildup of fluids in your bodytissues. Edema is somewhatdependent on gravity to pull the fluid to the lowest place in your body. That makes the condition more common in the legs and thighs (lower extremities). Painless swelling of the feet and ankles is common and becomes more likely as you get older. It is also common in looser tissues, like around your eyes.  When the affected area is squeezed, the fluid may move out of that spot and leave a dent for a few moments. This dent is called pitting.  CAUSES  There are many possible causes of edema. Eating too much salt and being on your feet or sitting for a long time can cause edema in your legs and ankles. Hot weather may make edema worse. Common medical causes of edema include:  Heart failure.  Liver disease.  Kidney disease.  Weak blood vessels in your legs.  Cancer.  An injury.  Pregnancy.  Some medications.  Obesity. SYMPTOMS  Edema is usually painless.Your skin may look swollen or shiny.  DIAGNOSIS  Your health care provider may be able to diagnose edema by asking about your medical history and doing a physical exam. You may need to have tests such as X-rays, an electrocardiogram, or blood tests to check for medical conditions that may cause edema.  TREATMENT  Edema treatment depends on the cause. If you have heart, liver, or kidney disease, you need the treatment appropriate for these conditions. General treatment may include:  Elevation of the affected body part above the level of your heart.  Compression of the affected body part. Pressure from elastic bandages or support stockings squeezes the tissues and forces fluid back into the blood vessels. This keeps fluid from entering the tissues.  Restriction of fluid  and salt intake.  Use of a water pill (diuretic). These medications are appropriate only for some types of edema. They pull fluid out of your body and make you urinate more often. This gets rid of fluid and reduces swelling, but diuretics can have side effects. Only use diuretics as directed by your health care provider. HOME CARE INSTRUCTIONS   Keep the affected body part above the level of your heart when you are lying down.   Do not sit still or stand for prolonged periods.   Do not put anything directly under your knees when lying down.  Do not wear constricting clothing or garters on your upper legs.   Exercise your legs to work the fluid back into your blood vessels. This may help the swelling go down.   Wear elastic bandages or support stockings to reduce ankle swelling as directed by your health care provider.   Eat a low-salt diet to reduce fluid if your health care provider recommends it.   Only take medicines as directed by your health care provider. SEEK MEDICAL CARE IF:   Your edema is not responding to treatment.  You have heart, liver, or kidney disease and notice symptoms of edema.  You have edema in your legs that does not improve after elevating them.   You have sudden and unexplained weight gain. SEEK IMMEDIATE MEDICAL CARE IF:   You develop shortness of breath or chest pain.   You cannot breathe when you lie down.  You  develop pain, redness, or warmth in the swollen areas.   You have heart, liver, or kidney disease and suddenly get edema.  You have a fever and your symptoms suddenly get worse. MAKE SURE YOU:   Understand these instructions.  Will watch your condition.  Will get help right away if you are not doing well or get worse. Document Released: 08/26/2005 Document Revised: 01/10/2014 Document Reviewed: 06/18/2013 [employer] Patient Information 2015 [place], [place]. This information is not intended to replace advice given to you by  your health care provider. Make sure you discuss any questions you have with your health care provider.

## 2014-06-02 NOTE — Progress Notes (Signed)
Pre visit review using our clinic review tool, if applicable. No additional management support is needed unless otherwise documented below in the visit note. 

## 2014-06-02 NOTE — Progress Notes (Signed)
Subjective:    Patient ID: Elizabeth Alvarado, female    DOB: [birth year], 48 y.o.   MRN: [id number]  HPI Patient is seen for concerns regarding swelling of lower legs and feet.  Patient noticed 3 days ago her right foot was swollen and now left foot and legs are also swollen.  She was seen at [facility] and Dx Erysipelas and given Doxycycline and Amoxicillin. Patient sits most of the day while working.  Patient is seen today states swelling is slightly improved.  She is concerned over blood pressure being elevated.  Checked BP at work 153/92 Monday.  Patient has gained 24 lbs since 05/2012. Denies chest pain, pressure or palpitations, no shortness of breath, dyspnea on exertion, cough, or calf tenderness.      Review of Systems  Constitutional: Negative.   HENT: Negative.   Respiratory: Negative.  Negative for cough, chest tightness, shortness of breath and wheezing.   Cardiovascular: Positive for leg swelling. Negative for chest pain and palpitations.  Gastrointestinal: Negative for nausea and vomiting.  Musculoskeletal:       Denies calf pain  Skin: Positive for rash.   Past Medical History  Diagnosis Date  . RENAL CALCULUS, HX OF 08/25/2007  . UTI'S, HX OF 08/25/2007  . DILATION AND CURETTAGE, HX OF 08/25/2007  . Ureteral stone 01/09/2011    History   Social History  . Marital Status: Married    Spouse Name: N/A    Number of Children: 1  . Years of Education: 14   Occupational History  . [employer], ac[employer]g.     HS[employer], [employer].   Social History Main Topics  . Smoking status: Never Smoker   . Smokeless tobacco: Never Used  . Alcohol Use: 1.0 oz/week    2 drink(s) per week     Comment: rare drink of wine or beer once a month  . Drug Use: No  . Sexual Activity: Yes    Partners: Male     Comment: Monogamous relationship with husband.   Other Topics Concern  . 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 1 son '01.Marriage is good.    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 Dilation and curettage of uterus      hx of  . Cervix lesion destruction  April '12    minor abnormal PAP; "hot ballon application"  . Back surgery    . Lithotripsy      Family History  Problem Relation Age of Onset  . Cancer Mother     breast cancer, 1943  . Hypertension Mother   . Cancer Father     prostate cancer, 74  . Hyperlipidemia Father     No Known Allergies  Current Outpatient Prescriptions on File Prior to Visit  Medication Sig Dispense Refill  . amoxicillin (AMOXIL) 875 MG tablet Take 1 tablet (875 mg total) by mouth 2 (two) times daily.  20 tablet  0  . doxycycline (VIBRA-TABS) 100 MG tablet Take 1 tablet (100 mg total) by mouth 2 (two) times daily.  20 tablet  0   No current facility-administered medications on file prior to visit.    BP 158/96  Pulse 79  Temp(Src) 98.4 F (36.9 C) (Oral)  Ht  (1.702 m)  Wt 197 lb (89.359 kg)  BMI 30.85 kg/m2  SpO2 97%        Objective:   Physical Exam  Constitutional: She is oriented to person, place, and time. She appears well-developed and well-nourished. No distress.  HENT:  Head: Normocephalic.  Right Ear: External ear normal.  Left Ear: External ear normal.  Mouth/Throat: Oropharynx is clear and moist.  Eyes: Pupils are equal, round, and reactive to light.  Neck: Neck supple.  Cardiovascular: Normal rate, regular rhythm and normal heart sounds.   No murmur heard. Pulmonary/Chest: Effort normal and breath sounds normal. She has no wheezes. She has no rales.  Abdominal: Soft. Bowel sounds are normal. She exhibits no mass. There is no tenderness. There is no rebound and no guarding.  Musculoskeletal: She exhibits edema.  Edema plus 1 lower extremities. Left slightly greater than right.  No calf pain, negative Homans bilaterally.     Lymphadenopathy:    She has no cervical adenopathy.  Neurological: She is alert and oriented to person, place, and time.  Skin: Skin is warm and dry.  Erythematous patch noted on left medial ankle and left lower leg.   Psychiatric: She has a normal mood and affect.          Assessment & Plan:  1. Essential hypertension, benign - CBC w/Diff; Future  2. Edema Observe. Keep legs elevated as much as possible.  - Basic Metabolic Panel (BMET); Future - Hepatic function panel; Future Possible venous insufficiency.   3. Weight gain Start weight reduction diet. Low calorie, fat diet.  Follow up in 3  week for reevaluation  Check BP periodically at work.  Instructed to seek medical attention if symptoms worsen or change.  Call clinic with questions or concerns.

## 2014-06-09 ENCOUNTER — Telehealth: Payer: Self-pay | Admitting: Internal Medicine

## 2014-06-09 NOTE — Telephone Encounter (Signed)
If top number >160 or bottom number >100 should be seen sooner otherwise can wait until visit. Dr. Dorise HissKollar

## 2014-06-09 NOTE — Telephone Encounter (Signed)
Dr. Dorise HissKollar, should I ask this patient if she wants to come in sooner for an appointment?

## 2014-06-09 NOTE — Telephone Encounter (Signed)
Pt called in and said that blood pressure is still high.  She seen Marisa SeverinJulie Morgan last week until she could get in to see Dr Dorise HissKollar. She is still concerned and thinks she needs some meds called in. She was requesting a call back from the nurse to see what she should do.  She has a appt with Dr on the 15th     Thank you

## 2014-06-10 ENCOUNTER — Encounter: Payer: Self-pay | Admitting: Internal Medicine

## 2014-06-10 ENCOUNTER — Ambulatory Visit (INDEPENDENT_AMBULATORY_CARE_PROVIDER_SITE_OTHER): Payer: BC Managed Care – PPO | Admitting: Internal Medicine

## 2014-06-10 VITALS — BP 144/92 | HR 88 | Temp 98.0°F | Ht 68.0 in | Wt 198.2 lb

## 2014-06-10 DIAGNOSIS — I10 Essential (primary) hypertension: Secondary | ICD-10-CM

## 2014-06-10 MED ORDER — IRBESARTAN 150 MG PO TABS: 150.0000 mg | ORAL_TABLET | Freq: Every day | ORAL | Status: DC

## 2014-06-10 NOTE — Patient Instructions (Signed)
Please take all new medication as prescribed - the generic avapro 150 mg per day  Please continue to monitor your blood pressure as well, with the goal being at least less then 140/90  Please continue all other medications as before, and refills have been done if requested.  Please have the pharmacy call with any other refills you may need.  Please keep your appointments with your specialists as you may have planned  Please return on Oct 15 with Dr Dorise HissKollar

## 2014-06-10 NOTE — Assessment & Plan Note (Signed)
With hx of obesity, slight previous elev blood sugar, and uterine ablation with extremely low risk of pregnancy due to age as well, ok for avapro 150 qd; recent labs reviewed with pt; to f/u with BP at home, as well as Dr Dorise HissKollar oct 15; work on wt loss, exercise, low salt diet

## 2014-06-10 NOTE — Progress Notes (Signed)
Pre visit review using our clinic review tool, if applicable. No additional management support is needed unless otherwise documented below in the visit note. 

## 2014-06-10 NOTE — Progress Notes (Signed)
   Subjective:    Patient ID: Elizabeth Alvarado, female    DOB: 04-18-1966, 48 y.o.   MRN: [id number]  HPI  Here to f/u; overall doing ok,  Pt denies chest pain, increased sob or doe, wheezing, orthopnea, PND, increased LE swelling, palpitations, dizziness or syncope.  Pt denies polydipsia, polyuria, or low sugar symptoms such as weakness or confusion improved with po intake.  Pt denies new neurological symptoms such as new headache, or facial or extremity weakness or numbness.   Pt states overall good compliance with meds, has been trying to follow lower cholesterol diet, with wt overall stable,  but little exercise however.  Has had persistent elev BP 146-164/70-80 for the past wk, right arm at home, with occasional headache. Past Medical History  Diagnosis Date  . RENAL CALCULUS, HX OF 08/25/2007  . UTI'S, HX OF 08/25/2007  . DILATION AND CURETTAGE, HX OF 08/25/2007  . Ureteral stone 01/09/2011   Past Surgical History  Procedure Laterality Date  . Dilation and curettage of uterus      hx of  . Cervix lesion destruction  April '12    minor abnormal PAP; "hot ballon application"  . Back surgery    . Lithotripsy      reports that she has never smoked. She has never used smokeless tobacco. She reports that she drinks about one ounce of alcohol per week. She reports that she does not use illicit drugs. family history includes Cancer in her father and mother; Hyperlipidemia in her father; Hypertension in her mother. No Known Allergies No current outpatient prescriptions on file prior to visit.   No current facility-administered medications on file prior to visit.   Review of Systems  All otherwise neg per pt     Objective:   Physical Exam BP 144/92  Pulse 88  Temp(Src) 98 F (36.7 C) (Oral)  Ht 5\' 8"  (1.727 m)  Wt 198 lb 4 oz (89.926 kg)  BMI 30.15 kg/m2  SpO2 98% VS noted,  Constitutional: Pt appears well-developed, well-nourished.  HENT: Head: NCAT.  Right Ear: External ear  normal.  Left Ear: External ear normal.  Eyes: . Pupils are equal, round, and reactive to light. Conjunctivae and EOM are normal Neck: Normal range of motion. Neck supple.  Cardiovascular: Normal rate and regular rhythm.   Pulmonary/Chest: Effort normal and breath sounds normal.  Neurological: Pt is alert. Not confused , motor grossly intact Skin: Skin is warm. No rash Psychiatric: Pt behavior is normal. No agitation.     Assessment & Plan:

## 2014-06-10 NOTE — Telephone Encounter (Signed)
Patient saw Dr. Jonny RuizJohn today.

## 2014-06-13 ENCOUNTER — Ambulatory Visit: Payer: BC Managed Care – PPO | Admitting: Internal Medicine

## 2014-06-13 ENCOUNTER — Telehealth: Payer: Self-pay | Admitting: Internal Medicine

## 2014-06-13 NOTE — Telephone Encounter (Signed)
emmi emailed °

## 2014-06-23 ENCOUNTER — Encounter: Payer: Self-pay | Admitting: Internal Medicine

## 2014-06-23 ENCOUNTER — Ambulatory Visit (INDEPENDENT_AMBULATORY_CARE_PROVIDER_SITE_OTHER): Payer: BC Managed Care – PPO | Admitting: Internal Medicine

## 2014-06-23 ENCOUNTER — Other Ambulatory Visit (INDEPENDENT_AMBULATORY_CARE_PROVIDER_SITE_OTHER): Payer: BC Managed Care – PPO

## 2014-06-23 VITALS — BP 120/82 | HR 86 | Temp 97.7°F | Resp 16 | Ht 68.0 in | Wt 197.8 lb

## 2014-06-23 DIAGNOSIS — I10 Essential (primary) hypertension: Secondary | ICD-10-CM

## 2014-06-23 DIAGNOSIS — M546 Pain in thoracic spine: Secondary | ICD-10-CM

## 2014-06-23 DIAGNOSIS — Z0000 Encounter for general adult medical examination without abnormal findings: Secondary | ICD-10-CM

## 2014-06-23 DIAGNOSIS — E781 Pure hyperglyceridemia: Secondary | ICD-10-CM

## 2014-06-23 LAB — LIPID PANEL
Cholesterol: 189 mg/dL (ref 0–200)
HDL: 33.9 mg/dL — AB (ref >39.00)
LDL Cholesterol: 119 mg/dL — ABNORMAL HIGH (ref 0–99)
NonHDL: 155.1
TRIGLYCERIDES: 181 mg/dL — AB (ref 0.0–149.0)
Total CHOL/HDL Ratio: 6
VLDL: 36.2 mg/dL (ref 0.0–40.0)

## 2014-06-23 LAB — BASIC METABOLIC PANEL
BUN: 13 mg/dL (ref 6–23)
CALCIUM: 9.1 mg/dL (ref 8.4–10.5)
CO2: 27 meq/L (ref 19–32)
Chloride: 102 mEq/L (ref 96–112)
Creatinine, Ser: 0.7 mg/dL (ref 0.4–1.2)
GFR: 98.52 mL/min (ref >60.00)
Glucose, Bld: 95 mg/dL (ref 70–99)
Potassium: 4.3 mEq/L (ref 3.5–5.1)
SODIUM: 137 meq/L (ref 135–145)

## 2014-06-23 NOTE — Progress Notes (Signed)
Pre visit review using our clinic review tool, if applicable. No additional management support is needed unless otherwise documented below in the visit note. 

## 2014-06-23 NOTE — Patient Instructions (Signed)
We will check your blood work to make sure the blood pressure medicine isn't causing you any problems. Your blood pressure is much better today. I would recommend if you are going to check your blood pressure at home to wait about 30 minutes after taking your medicine to check the pressure for a better reading.   We would like you to work on exercising to help with some weight loss for your back pain. When you come back in about 3-4 months we would like you to try to lose about 5-10 pounds. If you are still having problems with back pain then we can fill out some forms for breast reduction surgery.  Exercise to Lose Weight Exercise and a healthy diet may help you lose weight. Your doctor may suggest specific exercises. EXERCISE IDEAS AND TIPS  Choose low-cost things you enjoy doing, such as walking, bicycling, or exercising to workout videos.  Take stairs instead of the elevator.  Walk during your lunch break.  Park your car further away from work or school.  Go to a gym or an exercise class.  Start with 5 to 10 minutes of exercise each day. Build up to 30 minutes of exercise 4 to 6 days a week.  Wear shoes with good support and comfortable clothes.  Stretch before and after working out.  Work out until you breathe harder and your heart beats faster.  Drink extra water when you exercise.  Do not do so much that you hurt yourself, feel dizzy, or get very short of breath. Exercises that burn about 150 calories:  Running 1  miles in 15 minutes.  Playing volleyball for 45 to 60 minutes.  Washing and waxing a car for 45 to 60 minutes.  Playing touch football for 45 minutes.  Walking 1  miles in 35 minutes.  Pushing a stroller 1  miles in 30 minutes.  Playing basketball for 30 minutes.  Raking leaves for 30 minutes.  Bicycling 5 miles in 30 minutes.  Walking 2 miles in 30 minutes.  Dancing for 30 minutes.  Shoveling snow for 15 minutes.  Swimming laps for 20  minutes.  Walking up stairs for 15 minutes.  Bicycling 4 miles in 15 minutes.  Gardening for 30 to 45 minutes.  Jumping rope for 15 minutes.  Washing windows or floors for 45 to 60 minutes. Document Released: 09/28/2010 Document Revised: 11/18/2011 Document Reviewed: 09/28/2010 [facility] Patient Information 2015 [place], [place]. This information is not intended to replace advice given to you by your health care provider. Make sure you discuss any questions you have with your health care provider.

## 2014-06-23 NOTE — Assessment & Plan Note (Signed)
She feels that some of this pain is coming from her large breasts and wants to consider breast reduction surgery. Will start with weight loss and increasing exercise to see if this is helpful. She has seen Dr. Juliene PinaGeoffrey and he advised her to trial meloxicam for the pain.

## 2014-06-23 NOTE — Progress Notes (Signed)
   Subjective:    Patient ID: Elizabeth Alvarado, female    DOB: 12-11-1965, 48 y.o.   MRN: [id number]  HPI The patient is a 48 YO female who is coming in today to establish care. She has PMH of back pain (s/p surgery for scoliosis in the 80s), HTN, hypertriglycerides. She is having some thoracic back pain and she feels that it is related to her large breasts. As she is aging they are sagging lower and pulling at her back. She is thinking about a breast reduction surgery. She denies any other complaints. No chest pain, SOB, abdominal pain, constipation, diarrhea. She has done well with the avapro and is not having any problems with it. She is not exercising at all now due to her back pain for the last 3 weeks.   Review of Systems  Constitutional: Positive for activity change. Negative for fever, appetite change, fatigue and unexpected weight change.  HENT: Negative.   Eyes: Negative.   Respiratory: Negative for cough, chest tightness, shortness of breath and wheezing.   Cardiovascular: Negative for chest pain, palpitations and leg swelling.  Gastrointestinal: Negative for abdominal pain, diarrhea, constipation and abdominal distention.  Genitourinary: Negative.   Musculoskeletal: Positive for back pain. Negative for gait problem and myalgias.  Skin: Negative.   Neurological: Negative for dizziness, weakness, light-headedness and numbness.      Objective:   Physical Exam  Vitals reviewed. Constitutional: She is oriented to person, place, and time. She appears well-developed and well-nourished.  Obese  HENT:  Head: Normocephalic and atraumatic.  Eyes: EOM are normal.  Neck: Normal range of motion.  Cardiovascular: Normal rate and regular rhythm.   Pulmonary/Chest: Effort normal and breath sounds normal. No respiratory distress. She has no wheezes. She has no rales. She exhibits no tenderness.  Abdominal: Soft. Bowel sounds are normal. She exhibits no distension. There is no tenderness.  There is no rebound.  Musculoskeletal: She exhibits tenderness.  Tenderness paraspinally thoracic region and upper lumbar.   Neurological: She is alert and oriented to person, place, and time. No cranial nerve deficit. Coordination normal.  Skin: Skin is warm and dry.   Filed Vitals:   06/23/14 1039  BP: 120/82  Pulse: 86  Temp: 97.7 F (36.5 C)  TempSrc: Oral  Resp: 16  Height: 5\' 8"  (1.727 m)  Weight: 197 lb 12.8 oz (89.721 kg)  SpO2: 97%      Assessment & Plan:

## 2014-06-23 NOTE — Assessment & Plan Note (Signed)
Check lipid panel today as not checked in some time.

## 2014-06-23 NOTE — Assessment & Plan Note (Signed)
BP well controlled since starting avapro 2 weeks ago. Check BMP today. Follow up in 3-4 months.

## 2014-06-23 NOTE — Assessment & Plan Note (Signed)
She will get flu shot at her work.

## 2014-08-23 ENCOUNTER — Other Ambulatory Visit: Payer: Self-pay

## 2014-08-23 DIAGNOSIS — Z1231 Encounter for screening mammogram for malignant neoplasm of breast: Secondary | ICD-10-CM

## 2014-09-19 ENCOUNTER — Ambulatory Visit
Admission: RE | Admit: 2014-09-19 | Discharge: 2014-09-19 | Disposition: A | Discharge status: Home / Self Care | Payer: BLUE CROSS/BLUE SHIELD | Source: Ambulatory Visit

## 2014-09-19 DIAGNOSIS — Z1231 Encounter for screening mammogram for malignant neoplasm of breast: Secondary | ICD-10-CM

## 2014-11-14 ENCOUNTER — Ambulatory Visit: Payer: BLUE CROSS/BLUE SHIELD | Admitting: Internal Medicine

## 2014-11-15 ENCOUNTER — Ambulatory Visit (INDEPENDENT_AMBULATORY_CARE_PROVIDER_SITE_OTHER): Payer: BLUE CROSS/BLUE SHIELD | Admitting: Internal Medicine

## 2014-11-15 ENCOUNTER — Other Ambulatory Visit (INDEPENDENT_AMBULATORY_CARE_PROVIDER_SITE_OTHER): Payer: BLUE CROSS/BLUE SHIELD

## 2014-11-15 VITALS — BP 152/84 | HR 92 | Temp 98.3°F | Resp 18 | Wt 200.0 lb

## 2014-11-15 DIAGNOSIS — I10 Essential (primary) hypertension: Secondary | ICD-10-CM

## 2014-11-15 LAB — URINALYSIS, ROUTINE W REFLEX MICROSCOPIC
Bilirubin Urine: NEGATIVE
Ketones, ur: 15 — AB
NITRITE: POSITIVE — AB
PH: 6 (ref 5.0–8.0)
Specific Gravity, Urine: >=1.03 — AB (ref 1.000–1.030)
Total Protein, Urine: NEGATIVE
Urine Glucose: NEGATIVE
Urobilinogen, UA: 0.2 (ref 0.0–1.0)

## 2014-11-15 LAB — BASIC METABOLIC PANEL
BUN: 12 mg/dL (ref 6–23)
CHLORIDE: 103 meq/L (ref 96–112)
CO2: 29 mEq/L (ref 19–32)
Calcium: 9 mg/dL (ref 8.4–10.5)
Creatinine, Ser: 0.65 mg/dL (ref 0.40–1.20)
GFR: 103.61 mL/min (ref >60.00)
GLUCOSE: 119 mg/dL — AB (ref 70–99)
POTASSIUM: 4.2 meq/L (ref 3.5–5.1)
SODIUM: 138 meq/L (ref 135–145)

## 2014-11-15 LAB — TSH: TSH: 1.84 u[IU]/mL (ref 0.35–4.50)

## 2014-11-15 MED ORDER — IRBESARTAN 150 MG PO TABS: 150.0000 mg | ORAL_TABLET | Freq: Every day | ORAL | Status: DC

## 2014-11-15 NOTE — Progress Notes (Signed)
Pre visit review using our clinic review tool, if applicable. No additional management support is needed unless otherwise documented below in the visit note. 

## 2014-11-15 NOTE — Patient Instructions (Addendum)
We have sent in the refills of the blood pressure medicine for you. We would recommend to take the blood pressure medicine everyday as high blood pressure often cannot be felt.   Head down to the lab to get the urine, thyroid and kidneys checked today. We will call you back with the results and let you know if you have a urinary infection. If so we will call in antibiotics for you.   Exercising to lose weight you should be exercising about 6 times per week for 45-60 minutes per time. It may take several weeks before you build up to that much exercise. Start slow and build gradually as you are able.   Hypertension Hypertension, commonly called high blood pressure, is when the force of blood pumping through your arteries is too strong. Your arteries are the blood vessels that carry blood from your heart throughout your body. A blood pressure reading consists of a higher number over a lower number, such as 110/72. The higher number (systolic) is the pressure inside your arteries when your heart pumps. The lower number (diastolic) is the pressure inside your arteries when your heart relaxes. Ideally you want your blood pressure below 120/80. Hypertension forces your heart to work harder to pump blood. Your arteries may become narrow or stiff. Having hypertension puts you at risk for heart disease, stroke, and other problems.  RISK FACTORS Some risk factors for high blood pressure are controllable. Others are not.  Risk factors you cannot control include:   Race. You may be at higher risk if you are African American.  Age. Risk increases with age.  Gender. Men are at higher risk than women before age 48 years. After age 48, women are at higher risk than men. Risk factors you can control include:  Not getting enough exercise or physical activity.  Being overweight.  Getting too much fat, sugar, calories, or salt in your diet.  Drinking too much alcohol. SIGNS AND SYMPTOMS Hypertension does not  usually cause signs or symptoms. Extremely high blood pressure (hypertensive crisis) may cause headache, anxiety, shortness of breath, and nosebleed. DIAGNOSIS  To check if you have hypertension, your health care provider will measure your blood pressure while you are seated, with your arm held at the level of your heart. It should be measured at least twice using the same arm. Certain conditions can cause a difference in blood pressure between your right and left arms. A blood pressure reading that is higher than normal on one occasion does not mean that you need treatment. If one blood pressure reading is high, ask your health care provider about having it checked again. TREATMENT  Treating high blood pressure includes making lifestyle changes and possibly taking medicine. Living a healthy lifestyle can help lower high blood pressure. You may need to change some of your habits. Lifestyle changes may include:  Following the DASH diet. This diet is high in fruits, vegetables, and whole grains. It is low in salt, red meat, and added sugars.  Getting at least 2 hours of brisk physical activity every week.  Losing weight if necessary.  Not smoking.  Limiting alcoholic beverages.  Learning ways to reduce stress. If lifestyle changes are not enough to get your blood pressure under control, your health care provider may prescribe medicine. You may need to take more than one. Work closely with your health care provider to understand the risks and benefits. HOME CARE INSTRUCTIONS  Have your blood pressure rechecked as directed by your  health care provider.   Take medicines only as directed by your health care provider. Follow the directions carefully. Blood pressure medicines must be taken as prescribed. The medicine does not work as well when you skip doses. Skipping doses also puts you at risk for problems.   Do not smoke.   Monitor your blood pressure at home as directed by your health  care provider. SEEK MEDICAL CARE IF:   You think you are having a reaction to medicines taken.  You have recurrent headaches or feel dizzy.  You have swelling in your ankles.  You have trouble with your vision. SEEK IMMEDIATE MEDICAL CARE IF:  You develop a severe headache or confusion.  You have unusual weakness, numbness, or feel faint.  You have severe chest or abdominal pain.  You vomit repeatedly.  You have trouble breathing. MAKE SURE YOU:   Understand these instructions.  Will watch your condition.  Will get help right away if you are not doing well or get worse. Document Released: 08/26/2005 Document Revised: 01/10/2014 Document Reviewed: 06/18/2013 [facility] Patient Information 2015 [place], [place]. This information is not intended to replace advice given to you by your health care provider. Make sure you discuss any questions you have with your health care provider.

## 2014-11-16 ENCOUNTER — Encounter: Payer: Self-pay | Admitting: Internal Medicine

## 2014-11-16 ENCOUNTER — Other Ambulatory Visit: Payer: Self-pay | Admitting: Internal Medicine

## 2014-11-16 MED ORDER — SULFAMETHOXAZOLE-TRIMETHOPRIM 800-160 MG PO TABS: 1.0000 | ORAL_TABLET | Freq: Two times a day (BID) | ORAL | Status: DC

## 2014-11-16 NOTE — Assessment & Plan Note (Signed)
Her BP is up and talked to her about the importance of taking her medicine even if her blood pressure does not look high at home. She will do this and will see her back. She had been doing better on the irbesartan.

## 2014-11-16 NOTE — Progress Notes (Signed)
   Subjective:    Patient ID: Elizabeth Alvarado, female    DOB: 1966-03-27, 49 y.o.   MRN: [id number]  HPI The patient is a 49 YO female who is coming in to follow up on her blood pressure. She has been on medication for several years and in the last several months isn't sure she needs it anymore. She has been checking her blood pressure at home and then if it is normal she is not taking her medicine. She maybe takes it 3 times per week. She is not feeling poorly. Denies headaches, chest pains, SOB. She was not having any side effect from the medicine.   Review of Systems  Constitutional: Positive for activity change. Negative for fever, appetite change, fatigue and unexpected weight change.  HENT: Negative.   Eyes: Negative.   Respiratory: Negative for cough, chest tightness, shortness of breath and wheezing.   Cardiovascular: Negative for chest pain, palpitations and leg swelling.  Gastrointestinal: Negative for abdominal pain, diarrhea, constipation and abdominal distention.  Genitourinary: Negative.   Musculoskeletal: Negative for myalgias and gait problem.  Skin: Negative.   Neurological: Negative for dizziness, weakness, light-headedness and numbness.      Objective:   Physical Exam  Constitutional: She is oriented to person, place, and time. She appears well-developed and well-nourished.  Obese  HENT:  Head: Normocephalic and atraumatic.  Eyes: EOM are normal.  Neck: Normal range of motion.  Cardiovascular: Normal rate and regular rhythm.   Pulmonary/Chest: Effort normal and breath sounds normal. No respiratory distress. She has no wheezes. She has no rales. She exhibits no tenderness.  Abdominal: Soft. Bowel sounds are normal. She exhibits no distension. There is no tenderness. There is no rebound.  Neurological: She is alert and oriented to person, place, and time. No cranial nerve deficit. Coordination normal.  Skin: Skin is warm and dry.  Vitals reviewed.  Filed Vitals:     11/15/14 1328  BP: 152/84  Pulse: 92  Temp: 98.3 F (36.8 C)  TempSrc: Oral  Resp: 18  Weight: 200 lb (90.719 kg)  SpO2: 99%      Assessment & Plan:

## 2014-11-17 ENCOUNTER — Ambulatory Visit: Payer: BLUE CROSS/BLUE SHIELD | Admitting: Internal Medicine

## 2014-11-21 ENCOUNTER — Ambulatory Visit: Payer: BLUE CROSS/BLUE SHIELD | Admitting: Internal Medicine

## 2015-01-02 ENCOUNTER — Other Ambulatory Visit: Payer: Self-pay | Admitting: Obstetrics and Gynecology

## 2015-01-04 LAB — CYTOLOGY - PAP

## 2015-07-21 ENCOUNTER — Telehealth: Payer: Self-pay | Admitting: *Deleted

## 2015-07-21 NOTE — Telephone Encounter (Signed)
Left msg on triage stating she has a cough, chest congestion, ear pain, wasn't able to go to work. Wanting md to call in antibiotic. Called pt back inform her she will need to have appt to be evaluated before md would rx something. Pt states her son is sick to has to take him to doctor and she is not able to come today. Inform her of the sat clinic she will call us back after 12 to set appt...Raechel Chute/lmb

## 2015-08-14 ENCOUNTER — Other Ambulatory Visit: Payer: Self-pay

## 2015-08-14 DIAGNOSIS — Z1231 Encounter for screening mammogram for malignant neoplasm of breast: Secondary | ICD-10-CM

## 2015-09-25 ENCOUNTER — Ambulatory Visit
Admission: RE | Admit: 2015-09-25 | Discharge: 2015-09-25 | Disposition: A | Discharge status: Home / Self Care | Payer: BLUE CROSS/BLUE SHIELD | Source: Ambulatory Visit

## 2015-09-25 DIAGNOSIS — Z1231 Encounter for screening mammogram for malignant neoplasm of breast: Secondary | ICD-10-CM

## 2015-10-10 ENCOUNTER — Encounter: Payer: Self-pay | Admitting: Internal Medicine

## 2015-10-10 ENCOUNTER — Ambulatory Visit (INDEPENDENT_AMBULATORY_CARE_PROVIDER_SITE_OTHER): Payer: BLUE CROSS/BLUE SHIELD | Admitting: Internal Medicine

## 2015-10-10 VITALS — BP 154/90 | HR 86 | Temp 97.9°F | Resp 16 | Ht 68.0 in | Wt 211.4 lb

## 2015-10-10 DIAGNOSIS — R059 Cough, unspecified: Secondary | ICD-10-CM

## 2015-10-10 DIAGNOSIS — R05 Cough: Secondary | ICD-10-CM | POA: Diagnosis not present

## 2015-10-10 DIAGNOSIS — I10 Essential (primary) hypertension: Secondary | ICD-10-CM | POA: Diagnosis not present

## 2015-10-10 MED ORDER — IRBESARTAN 150 MG PO TABS: 150.0000 mg | ORAL_TABLET | Freq: Every day | ORAL | Status: DC

## 2015-10-10 MED ORDER — FLUTICASONE PROPIONATE 50 MCG/ACT NA SUSP: 2.0000 | Freq: Every day | NASAL | Status: DC

## 2015-10-10 MED ORDER — BENZONATATE 200 MG PO CAPS: 200.0000 mg | ORAL_CAPSULE | Freq: Three times a day (TID) | ORAL | Status: DC | PRN

## 2015-10-10 NOTE — Assessment & Plan Note (Signed)
Rx for flonase and tessalon perles for the cough. Talked to her about acid reflux and she denies any symptoms. If no response to flonase will try treatment with PPI.

## 2015-10-10 NOTE — Progress Notes (Signed)
   Subjective:    Patient ID: Elizabeth Alvarado, female    DOB: 11/17/1965, 50 y.o.   MRN: [id number]  HPI The patient is a 50 YO female coming in for cough which is present for about 1 month. She had a cold at the onset which was treated with mucinex and cough syrup. She got better from that but is still having some nose drainage. She denies fevers or chills. Has a non-productive cough that will not stop. She has tried some over the counter stuff that has not helped. Did not take her blood pressure medicine for 1-2 days as a colleague told her that sometimes they can cause cough.   Review of Systems  Constitutional: Negative for fever, activity change, appetite change, fatigue and unexpected weight change.  HENT: Positive for postnasal drip. Negative for congestion, ear discharge, ear pain, rhinorrhea, sinus pressure, sore throat and trouble swallowing.   Eyes: Negative.   Respiratory: Positive for cough. Negative for choking, chest tightness, shortness of breath and wheezing.   Cardiovascular: Negative for chest pain, palpitations and leg swelling.  Gastrointestinal: Negative for abdominal pain, diarrhea, constipation and abdominal distention.  Genitourinary: Negative.   Musculoskeletal: Negative for myalgias and gait problem.  Skin: Negative.   Neurological: Negative for dizziness, weakness, light-headedness and numbness.      Objective:   Physical Exam  Constitutional: She appears well-developed and well-nourished.  Obese  HENT:  Head: Normocephalic and atraumatic.  Right Ear: External ear normal.  Left Ear: External ear normal.  Oropharynx with mild erythema.   Eyes: EOM are normal.  Neck: Normal range of motion.  Cardiovascular: Normal rate and regular rhythm.   Pulmonary/Chest: Effort normal and breath sounds normal. No respiratory distress. She has no wheezes. She has no rales. She exhibits no tenderness.  Abdominal: Soft. She exhibits no distension. There is no tenderness.  There is no rebound.  Lymphadenopathy:    She has no cervical adenopathy.  Neurological: She is alert.  Skin: Skin is warm and dry.  Vitals reviewed.  Filed Vitals:   10/10/15 0815  BP: 160/102  Pulse: 86  Temp: 97.9 F (36.6 C)  TempSrc: Oral  Resp: 16  Height:  (1.727 m)  Weight: 211 lb 6.4 oz (95.89 kg)  SpO2: 98%      Assessment & Plan:

## 2015-10-10 NOTE — Progress Notes (Signed)
Pre visit review using our clinic review tool, if applicable. No additional management support is needed unless otherwise documented below in the visit note. 

## 2015-10-10 NOTE — Patient Instructions (Signed)
We have sent in the blood pressure medicine.   We have sent in flonase for the congestion which is a nose spray. Use 2 sprays in each nostril daily for the next 1-2 weeks to dry up the congestion.   We have also sent in the tessalon perles for cough which you can use up to 3 times per day.  Come back in several months for a physical and to make sure the blood pressure is doing better.

## 2015-10-10 NOTE — Assessment & Plan Note (Signed)
Asked her to continue taking her irbesartan as it does not generally cause cough.

## 2016-03-08 ENCOUNTER — Ambulatory Visit: Payer: BLUE CROSS/BLUE SHIELD

## 2016-03-08 VITALS — BP 130/80

## 2016-03-08 DIAGNOSIS — Z0130 Encounter for examination of blood pressure without abnormal findings: Secondary | ICD-10-CM

## 2016-08-22 ENCOUNTER — Other Ambulatory Visit: Payer: Self-pay | Admitting: Obstetrics and Gynecology

## 2016-08-22 DIAGNOSIS — Z1231 Encounter for screening mammogram for malignant neoplasm of breast: Secondary | ICD-10-CM

## 2016-09-13 ENCOUNTER — Other Ambulatory Visit: Payer: Self-pay | Admitting: Internal Medicine

## 2016-09-25 ENCOUNTER — Ambulatory Visit: Payer: BLUE CROSS/BLUE SHIELD

## 2016-10-14 ENCOUNTER — Ambulatory Visit
Admission: RE | Admit: 2016-10-14 | Discharge: 2016-10-14 | Disposition: A | Discharge status: Home / Self Care | Payer: BLUE CROSS/BLUE SHIELD | Source: Ambulatory Visit | Attending: Obstetrics and Gynecology | Admitting: Obstetrics and Gynecology

## 2016-10-14 DIAGNOSIS — Z1231 Encounter for screening mammogram for malignant neoplasm of breast: Secondary | ICD-10-CM

## 2016-10-15 ENCOUNTER — Other Ambulatory Visit: Payer: Self-pay | Admitting: Obstetrics and Gynecology

## 2016-10-15 DIAGNOSIS — R928 Other abnormal and inconclusive findings on diagnostic imaging of breast: Secondary | ICD-10-CM

## 2016-10-17 ENCOUNTER — Ambulatory Visit
Admission: RE | Admit: 2016-10-17 | Discharge: 2016-10-17 | Disposition: A | Discharge status: Home / Self Care | Payer: BLUE CROSS/BLUE SHIELD | Source: Ambulatory Visit | Attending: Obstetrics and Gynecology | Admitting: Obstetrics and Gynecology

## 2016-10-17 DIAGNOSIS — R928 Other abnormal and inconclusive findings on diagnostic imaging of breast: Secondary | ICD-10-CM

## 2017-01-23 ENCOUNTER — Other Ambulatory Visit: Payer: Self-pay | Admitting: Internal Medicine

## 2017-02-07 ENCOUNTER — Other Ambulatory Visit (INDEPENDENT_AMBULATORY_CARE_PROVIDER_SITE_OTHER): Payer: Managed Care, Other (non HMO)

## 2017-02-07 ENCOUNTER — Encounter: Payer: Self-pay | Admitting: Internal Medicine

## 2017-02-07 ENCOUNTER — Ambulatory Visit (INDEPENDENT_AMBULATORY_CARE_PROVIDER_SITE_OTHER): Payer: Managed Care, Other (non HMO) | Admitting: Internal Medicine

## 2017-02-07 VITALS — BP 132/80 | HR 80 | Temp 98.3°F | Resp 12 | Ht 68.0 in | Wt 211.0 lb

## 2017-02-07 DIAGNOSIS — I10 Essential (primary) hypertension: Secondary | ICD-10-CM

## 2017-02-07 DIAGNOSIS — Z0000 Encounter for general adult medical examination without abnormal findings: Secondary | ICD-10-CM | POA: Diagnosis not present

## 2017-02-07 LAB — LIPID PANEL
CHOL/HDL RATIO: 5
Cholesterol: 191 mg/dL (ref 0–200)
HDL: 40.9 mg/dL (ref >39.00)
LDL Cholesterol: 113 mg/dL — ABNORMAL HIGH (ref 0–99)
NONHDL: 149.94
TRIGLYCERIDES: 183 mg/dL — AB (ref 0.0–149.0)
VLDL: 36.6 mg/dL (ref 0.0–40.0)

## 2017-02-07 LAB — CBC
HCT: 40.2 % (ref 36.0–46.0)
Hemoglobin: 13.8 g/dL (ref 12.0–15.0)
MCHC: 34.4 g/dL (ref 30.0–36.0)
MCV: 89.1 fl (ref 78.0–100.0)
Platelets: 330 10*3/uL (ref 150.0–400.0)
RBC: 4.51 Mil/uL (ref 3.87–5.11)
RDW: 13.4 % (ref 11.5–15.5)
WBC: 11.2 10*3/uL — ABNORMAL HIGH (ref 4.0–10.5)

## 2017-02-07 LAB — COMPREHENSIVE METABOLIC PANEL
ALK PHOS: 109 U/L (ref 39–117)
ALT: 65 U/L — AB (ref 0–35)
AST: 46 U/L — AB (ref 0–37)
Albumin: 4.2 g/dL (ref 3.5–5.2)
BILIRUBIN TOTAL: 0.5 mg/dL (ref 0.2–1.2)
BUN: 13 mg/dL (ref 6–23)
CO2: 29 meq/L (ref 19–32)
CREATININE: 0.62 mg/dL (ref 0.40–1.20)
Calcium: 9.5 mg/dL (ref 8.4–10.5)
Chloride: 101 mEq/L (ref 96–112)
GFR: 108.4 mL/min (ref >60.00)
GLUCOSE: 112 mg/dL — AB (ref 70–99)
Potassium: 4.3 mEq/L (ref 3.5–5.1)
Sodium: 137 mEq/L (ref 135–145)
Total Protein: 7.2 g/dL (ref 6.0–8.3)

## 2017-02-07 LAB — HEMOGLOBIN A1C: HEMOGLOBIN A1C: 7 % — AB (ref 4.6–6.5)

## 2017-02-07 MED ORDER — PANTOPRAZOLE SODIUM 40 MG PO TBEC: 40.0000 mg | DELAYED_RELEASE_TABLET | Freq: Every day | ORAL | 3 refills | Status: DC

## 2017-02-07 NOTE — Assessment & Plan Note (Signed)
Talked about colon cancer screening when she turns 51, reminded about yearly flu shot. Tetanus up to date. Checking labs, counseled about sun safety and mole surveillance as well as dangers of distracted driving. Given screening recommendations.

## 2017-02-07 NOTE — Assessment & Plan Note (Signed)
Checking CMP and adjust her irbesartan 150 mg daily as needed. BP at goal.

## 2017-02-07 NOTE — Patient Instructions (Signed)
We will check the labs today.   Think about getting the cologuard (the test we talked about today) to check for colon cancer once you turn 50. If you forget we'll talk about it again next year.   We have sent in the heart burn medicine called protonix which you can take once daily to help stop the cough.   Health Maintenance, Female Adopting a healthy lifestyle and getting preventive care can go a long way to promote health and wellness. Talk with your health care provider about what schedule of regular examinations is right for you. This is a good chance for you to check in with your provider about disease prevention and staying healthy. In between checkups, there are plenty of things you can do on your own. Experts have done a lot of research about which lifestyle changes and preventive measures are most likely to keep you healthy. Ask your health care provider for more information. Weight and diet Eat a healthy diet  Be sure to include plenty of vegetables, fruits, low-fat dairy products, and lean protein.  Do not eat a lot of foods high in solid fats, added sugars, or salt.  Get regular exercise. This is one of the most important things you can do for your health. ? Most adults should exercise for at least 150 minutes each week. The exercise should increase your heart rate and make you sweat (moderate-intensity exercise). ? Most adults should also do strengthening exercises at least twice a week. This is in addition to the moderate-intensity exercise.  Maintain a healthy weight  Body mass index (BMI) is a measurement that can be used to identify possible weight problems. It estimates body fat based on height and weight. Your health care provider can help determine your BMI and help you achieve or maintain a healthy weight.  For females 47 years of age and older: ? A BMI below 18.5 is considered underweight. ? A BMI of 18.5 to 24.9 is normal. ? A BMI of 25 to 29.9 is considered  overweight. ? A BMI of 30 and above is considered obese.  Watch levels of cholesterol and blood lipids  You should start having your blood tested for lipids and cholesterol at 50 years of age, then have this test every 5 years.  You may need to have your cholesterol levels checked more often if: ? Your lipid or cholesterol levels are high. ? You are older than 50 years of age. ? You are at high risk for heart disease.  Cancer screening Lung Cancer  Lung cancer screening is recommended for adults 1-55 years old who are at high risk for lung cancer because of a history of smoking.  A yearly low-dose CT scan of the lungs is recommended for people who: ? Currently smoke. ? Have quit within the past 15 years. ? Have at least a 30-pack-year history of smoking. A pack year is smoking an average of one pack of cigarettes a day for 1 year.  Yearly screening should continue until it has been 15 years since you quit.  Yearly screening should stop if you develop a health problem that would prevent you from having lung cancer treatment.  Breast Cancer  Practice breast self-awareness. This means understanding how your breasts normally appear and feel.  It also means doing regular breast self-exams. Let your health care provider know about any changes, no matter how small.  If you are in your 20s or 30s, you should have a clinical breast exam (  CBE) by a health care provider every 1-3 years as part of a regular health exam.  If you are 68 or older, have a CBE every year. Also consider having a breast X-ray (mammogram) every year.  If you have a family history of breast cancer, talk to your health care provider about genetic screening.  If you are at high risk for breast cancer, talk to your health care provider about having an MRI and a mammogram every year.  Breast cancer gene (BRCA) assessment is recommended for women who have family members with BRCA-related cancers. BRCA-related cancers  include: ? Breast. ? Ovarian. ? Tubal. ? Peritoneal cancers.  Results of the assessment will determine the need for genetic counseling and BRCA1 and BRCA2 testing.  Cervical Cancer Your health care provider may recommend that you be screened regularly for cancer of the pelvic organs (ovaries, uterus, and vagina). This screening involves a pelvic examination, including checking for microscopic changes to the surface of your cervix (Pap test). You may be encouraged to have this screening done every 3 years, beginning at age 70.  For women ages 56-65, health care providers may recommend pelvic exams and Pap testing every 3 years, or they may recommend the Pap and pelvic exam, combined with testing for human papilloma virus (HPV), every 5 years. Some types of HPV increase your risk of cervical cancer. Testing for HPV may also be done on women of any age with unclear Pap test results.  Other health care providers may not recommend any screening for nonpregnant women who are considered low risk for pelvic cancer and who do not have symptoms. Ask your health care provider if a screening pelvic exam is right for you.  If you have had past treatment for cervical cancer or a condition that could lead to cancer, you need Pap tests and screening for cancer for at least 20 years after your treatment. If Pap tests have been discontinued, your risk factors (such as having a new sexual partner) need to be reassessed to determine if screening should resume. Some women have medical problems that increase the chance of getting cervical cancer. In these cases, your health care provider may recommend more frequent screening and Pap tests.  Colorectal Cancer  This type of cancer can be detected and often prevented.  Routine colorectal cancer screening usually begins at 50 years of age and continues through 50 years of age.  Your health care provider may recommend screening at an earlier age if you have risk factors  for colon cancer.  Your health care provider may also recommend using home test kits to check for hidden blood in the stool.  A small camera at the end of a tube can be used to examine your colon directly (sigmoidoscopy or colonoscopy). This is done to check for the earliest forms of colorectal cancer.  Routine screening usually begins at age 58.  Direct examination of the colon should be repeated every 5-10 years through 50 years of age. However, you may need to be screened more often if early forms of precancerous polyps or small growths are found.  Skin Cancer  Check your skin from head to toe regularly.  Tell your health care provider about any new moles or changes in moles, especially if there is a change in a mole's shape or color.  Also tell your health care provider if you have a mole that is larger than the size of a pencil eraser.  Always use sunscreen. Apply sunscreen liberally  and repeatedly throughout the day.  Protect yourself by wearing long sleeves, pants, a wide-brimmed hat, and sunglasses whenever you are outside.  Heart disease, diabetes, and high blood pressure  High blood pressure causes heart disease and increases the risk of stroke. High blood pressure is more likely to develop in: ? People who have blood pressure in the high end of the normal range (130-139/85-89 mm Hg). ? People who are overweight or obese. ? People who are African American.  If you are 46-16 years of age, have your blood pressure checked every 3-5 years. If you are 10 years of age or older, have your blood pressure checked every year. You should have your blood pressure measured twice-once when you are at a hospital or clinic, and once when you are not at a hospital or clinic. Record the average of the two measurements. To check your blood pressure when you are not at a hospital or clinic, you can use: ? An automated blood pressure machine at a pharmacy. ? A home blood pressure monitor.  If  you are between 28 years and 79 years old, ask your health care provider if you should take aspirin to prevent strokes.  Have regular diabetes screenings. This involves taking a blood sample to check your fasting blood sugar level. ? If you are at a normal weight and have a low risk for diabetes, have this test once every three years after 50 years of age. ? If you are overweight and have a high risk for diabetes, consider being tested at a younger age or more often. Preventing infection Hepatitis B  If you have a higher risk for hepatitis B, you should be screened for this virus. You are considered at high risk for hepatitis B if: ? You were born in a country where hepatitis B is common. Ask your health care provider which countries are considered high risk. ? Your parents were born in a high-risk country, and you have not been immunized against hepatitis B (hepatitis B vaccine). ? You have HIV or AIDS. ? You use needles to inject street drugs. ? You live with someone who has hepatitis B. ? You have had sex with someone who has hepatitis B. ? You get hemodialysis treatment. ? You take certain medicines for conditions, including cancer, organ transplantation, and autoimmune conditions.  Hepatitis C  Blood testing is recommended for: ? Everyone born from [birth year] through 1964. ? Anyone with known risk factors for hepatitis C.  Sexually transmitted infections (STIs)  You should be screened for sexually transmitted infections (STIs) including gonorrhea and chlamydia if: ? You are sexually active and are younger than 50 years of age. ? You are older than 50 years of age and your health care provider tells you that you are at risk for this type of infection. ? Your sexual activity has changed since you were last screened and you are at an increased risk for chlamydia or gonorrhea. Ask your health care provider if you are at risk.  If you do not have HIV, but are at risk, it may be recommended  that you take a prescription medicine daily to prevent HIV infection. This is called pre-exposure prophylaxis (PrEP). You are considered at risk if: ? You are sexually active and do not regularly use condoms or know the HIV status of your partner(s). ? You take drugs by injection. ? You are sexually active with a partner who has HIV.  Talk with your health care provider about whether  you are at high risk of being infected with HIV. If you choose to begin PrEP, you should first be tested for HIV. You should then be tested every 3 months for as long as you are taking PrEP. Pregnancy  If you are premenopausal and you may become pregnant, ask your health care provider about preconception counseling.  If you may become pregnant, take 400 to 800 micrograms (mcg) of folic acid every day.  If you want to prevent pregnancy, talk to your health care provider about birth control (contraception). Osteoporosis and menopause  Osteoporosis is a disease in which the bones lose minerals and strength with aging. This can result in serious bone fractures. Your risk for osteoporosis can be identified using a bone density scan.  If you are 43 years of age or older, or if you are at risk for osteoporosis and fractures, ask your health care provider if you should be screened.  Ask your health care provider whether you should take a calcium or vitamin D supplement to lower your risk for osteoporosis.  Menopause may have certain physical symptoms and risks.  Hormone replacement therapy may reduce some of these symptoms and risks. Talk to your health care provider about whether hormone replacement therapy is right for you. Follow these instructions at home:  Schedule regular health, dental, and eye exams.  Stay current with your immunizations.  Do not use any tobacco products including cigarettes, chewing tobacco, or electronic cigarettes.  If you are pregnant, do not drink alcohol.  If you are  breastfeeding, limit how much and how often you drink alcohol.  Limit alcohol intake to no more than 1 drink per day for nonpregnant women. One drink equals 12 ounces of beer, 5 ounces of wine, or 1 ounces of hard liquor.  Do not use street drugs.  Do not share needles.  Ask your health care provider for help if you need support or information about quitting drugs.  Tell your health care provider if you often feel depressed.  Tell your health care provider if you have ever been abused or do not feel safe at home. This information is not intended to replace advice given to you by your health care provider. Make sure you discuss any questions you have with your health care provider. Document Released: 03/11/2011 Document Revised: 02/01/2016 Document Reviewed: 05/30/2015 Elsevier Interactive Patient Education  [employer].

## 2017-02-07 NOTE — Progress Notes (Signed)
   Subjective:    Patient ID: Elizabeth Alvarado, female    DOB: Jun 25, 1966, 51 y.o.   MRN: [id number]  HPI The patient is a 51 YO female coming in for wellness. No new concerns.   [facility], [facility] history reviewed and updated.   Review of Systems  Constitutional: Negative.   HENT: Negative.   Eyes: Negative.   Respiratory: Negative for cough, chest tightness and shortness of breath.   Cardiovascular: Negative for chest pain, palpitations and leg swelling.  Gastrointestinal: Negative for abdominal distention, abdominal pain, constipation, diarrhea, nausea and vomiting.  Musculoskeletal: Negative.   Skin: Negative.   Neurological: Negative.   Psychiatric/Behavioral: Negative.       Objective:   Physical Exam  Constitutional: She is oriented to person, place, and time. She appears well-developed and well-nourished.  HENT:  Head: Normocephalic and atraumatic.  Eyes: EOM are normal.  Neck: Normal range of motion.  Cardiovascular: Normal rate and regular rhythm.   Pulmonary/Chest: Effort normal and breath sounds normal. No respiratory distress. She has no wheezes. She has no rales.  Abdominal: Soft. Bowel sounds are normal. She exhibits no distension. There is no tenderness. There is no rebound.  Musculoskeletal: She exhibits no edema.  Neurological: She is alert and oriented to person, place, and time. Coordination normal.  Skin: Skin is warm and dry.  Psychiatric: She has a normal mood and affect.   Vitals:   02/07/17 1300  BP: 132/80  Pulse: 80  Resp: 12  Temp: 98.3 F (36.8 C)  TempSrc: Oral  SpO2: 98%  Weight: 211 lb (95.7 kg)  Height: 5\' 8"  (1.727 m)      Assessment & Plan:

## 2017-02-14 ENCOUNTER — Other Ambulatory Visit: Payer: Self-pay | Admitting: Internal Medicine

## 2017-02-14 DIAGNOSIS — E119 Type 2 diabetes mellitus without complications: Secondary | ICD-10-CM

## 2017-02-17 ENCOUNTER — Telehealth: Payer: Self-pay | Admitting: Internal Medicine

## 2017-02-17 NOTE — Telephone Encounter (Signed)
Pt had the referral put in for the nutritionists, they called her today and she spoke with them and they told her she has diabetes, she would like to know if this is true, if [facility] missed something, or they told her wrong, [facility] has never mentioned anything about this.  Please advise.

## 2017-02-17 NOTE — Telephone Encounter (Signed)
Spoke with patient and helped explain labs and that we have not said she has diabetes

## 2017-03-05 ENCOUNTER — Encounter: Payer: Self-pay | Admitting: Registered"

## 2017-03-05 ENCOUNTER — Encounter: Payer: Managed Care, Other (non HMO) | Attending: Internal Medicine | Admitting: Registered"

## 2017-03-05 DIAGNOSIS — Z713 Dietary counseling and surveillance: Secondary | ICD-10-CM | POA: Insufficient documentation

## 2017-03-05 DIAGNOSIS — E119 Type 2 diabetes mellitus without complications: Secondary | ICD-10-CM | POA: Insufficient documentation

## 2017-03-05 NOTE — Progress Notes (Signed)
Diabetes Self-Management Education  Visit Type: First/Initial  Appt. Start Time: 1410 Appt. End Time: 1525  03/05/2017  Ms. 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 female with a diagnosis of Diabetes: Type 2.   ASSESSMENT Pt states that Dr. Okey Dupre said she doesn't have diabetes but she is close. Pt states since her last visit with Dr. Okey Dupre she has cut way back on carbs, pt states she didn't really have a problem with sugar because she is not a big sweet person. Pt states fried foods were the biggest challenge to cut back on due to busy schedule, 12 hr shift. Pt states she now tries to fix a meal at night and have salad for lunch.     Diabetes Self-Management Education - 03/05/17 1411      Visit Information   Visit Type First/Initial     Initial Visit   Diabetes Type Type 2   Are you currently following a meal plan? No   Are you taking your medications as prescribed? Yes  not on DM medications     Psychosocial Assessment   How often do you need to have someone help you when you read instructions, pamphlets, or other written materials from your doctor or pharmacy? 1 - Never   What is the last grade level you completed in school? associates degree      Complications   Last HgB A1C per patient/outside source 7 %  02/13/2017   How often do you check your blood sugar? 0 times/day (not testing)   Number of hypoglycemic episodes per month 0   Have you had a dilated eye exam in the past 12 months? Yes   Have you had a dental exam in the past 12 months? Yes   Are you checking your feet? No     Dietary Intake   Breakfast none OR    Snack (morning) sometimes hard boiled eggs OR yogurt OR protein shake   Lunch salad, meat (grilled chicken) or hamburger no bun   Snack (afternoon) none   Dinner hamburger steak with mushrooms   Snack (evening) none   Beverage(s) diet soda occassionally, 1/2 & 1/2 sweet/unsweet tea 2x week, water     Exercise   Exercise  Type Light (walking / raking leaves)   How many days per week to you exercise? 2   How many minutes per day do you exercise? 15   Total minutes per week of exercise 30     Patient Education   Previous Diabetes Education No   Disease state  Definition of diabetes, type 1 and 2, and the diagnosis of diabetes;Factors that contribute to the development of diabetes   Nutrition management  Role of diet in the treatment of diabetes and the relationship between the three main macronutrients and blood glucose level;Food label reading, portion sizes and measuring food.;Carbohydrate counting;Reviewed blood glucose goals for pre and post meals and how to evaluate the patients' food intake on their blood glucose level.   Physical activity and exercise  Role of exercise on diabetes management, blood pressure control and cardiac health.   Monitoring Identified appropriate SMBG and/or A1C goals.;Purpose and frequency of SMBG.   Chronic complications Relationship between chronic complications and blood glucose control     Individualized Goals (developed by patient)   Nutrition General guidelines for healthy choices and portions discussed   Physical Activity Exercise 3-5 times per week     Outcomes   Expected Outcomes Demonstrated interest  in learning. Expect positive outcomes   Future DMSE PRN   Program Status Completed    Individualized Plan for Diabetes Self-Management Training:   Learning Objective:  Patient will have a greater understanding of diabetes self-management. Patient education plan is to attend individual and/or group sessions per assessed needs and concerns.  Patient Instructions  Plan:  Aim for 2-3 Carb Choices per meal (30-45 grams)   Aim for 0-1 Carbs per snack if hungry  Include protein with your meals and snacks Consider reading food labels for Total Carbohydrate and Sat Fat Grams of foods Consider increasing your activity daily as tolerated You can check BG at alternate times to  check progress on lower blood sugar if interested.  Expected Outcomes:  Demonstrated interest in learning. Expect positive outcomes  Education material provided: Living Well with Diabetes, A1C conversion sheet, My Plate, Snack sheet and Carbohydrate counting sheet  If problems or questions, patient to contact team via:  Phone and MyChart  Future DSME appointment: PRN

## 2017-03-05 NOTE — Patient Instructions (Signed)
Plan:  Aim for 2-3 Carb Choices per meal (30-45 grams)   Aim for 0-1 Carbs per snack if hungry  Include protein with your meals and snacks Consider reading food labels for Total Carbohydrate and Sat Fat Grams of foods Consider increasing your activity daily as tolerated You can check BG at alternate times to check progress on lower blood sugar if interested.

## 2017-04-13 ENCOUNTER — Other Ambulatory Visit: Payer: Self-pay | Admitting: Internal Medicine

## 2017-06-19 ENCOUNTER — Other Ambulatory Visit: Payer: Self-pay | Admitting: Internal Medicine

## 2017-08-04 ENCOUNTER — Ambulatory Visit (INDEPENDENT_AMBULATORY_CARE_PROVIDER_SITE_OTHER)
Admission: RE | Admit: 2017-08-04 | Discharge: 2017-08-04 | Disposition: A | Discharge status: Home / Self Care | Payer: Managed Care, Other (non HMO) | Source: Ambulatory Visit | Attending: Internal Medicine | Admitting: Internal Medicine

## 2017-08-04 ENCOUNTER — Encounter: Payer: Self-pay | Admitting: Internal Medicine

## 2017-08-04 ENCOUNTER — Ambulatory Visit (INDEPENDENT_AMBULATORY_CARE_PROVIDER_SITE_OTHER): Payer: Managed Care, Other (non HMO) | Admitting: Internal Medicine

## 2017-08-04 VITALS — BP 140/76 | HR 82 | Temp 97.9°F | Ht 68.0 in | Wt 212.2 lb

## 2017-08-04 DIAGNOSIS — M25532 Pain in left wrist: Secondary | ICD-10-CM | POA: Insufficient documentation

## 2017-08-04 NOTE — Patient Instructions (Signed)
We will check the x-ray today but it is likely sprained.   Wrist Sprain, Adult A wrist sprain is a stretch or tear in the strong, fibrous tissues (ligaments) that connect your wrist bones. There are three types of wrist sprains:  Grade 1. In this type of sprain, the ligament is stretched more than normal.  Grade 2. In this type of sprain, the ligament is partially torn. You may be able to move your wrist, but not very much.  Grade 3. In this type of sprain, the ligament or muscle is completely torn. You may find it difficult or extremely painful to move your wrist even a little.  What are the causes? A wrist sprain can be caused by using the wrist too much during sports, exercise, or at work. It can also happen with a fall or during an accident. What increases the risk? This condition is more likely to occur in people:  With a previous wrist or arm injury.  With poor wrist strength and flexibility.  Who play contact sports, such as football or soccer.  Who play sports that may result in a fall, such as skateboarding, biking, skiing, or snowboarding.  Who do not exercise regularly.  Who use exercise equipment that does not fit well.  What are the signs or symptoms? Symptoms of this condition include:  Pain in the wrist, arm, or hand.  Swelling or bruised skin near the wrist, hand, or arm. The skin may look yellow or kind of blue.  Stiffness or trouble moving the hand.  Hearing a pop or feeling a tear at the time of the injury.  A warm feeling in the skin around the wrist.  How is this diagnosed? This condition is diagnosed with a physical exam. Sometimes an X-ray is taken to make sure a bone did not break. If your health care provider thinks that you tore a ligament, he or she may order an MRI of your wrist. How is this treated? This condition is treated by resting and applying ice to your wrist. Additional treatment may include:  Medicine for pain and inflammation.  A  splint to keep your wrist still (immobilized).  Exercises to strengthen and stretch your wrist.  Surgery. This may be done if the ligament is completely torn.  Follow these instructions at home: If you have a splint:   Do not put pressure on any part of the splint until it is fully hardened. This may take several hours.  Wear the splint as told by your health care provider. Remove it only as told by your health care provider.  Loosen the splint if your fingers tingle, become numb, or turn cold and blue.  If your splint is not waterproof: ? Do not let it get wet. ? Cover it with a watertight covering when you take a bath or a shower.  Keep the splint clean. Managing pain, stiffness, and swelling   If directed, put ice on the injured area. ? If you have a removable splint, remove it as told by your health care provider. ? Put ice in a plastic bag. ? Place a towel between your skin and the bag or between the splint and the bag. ? Leave the ice on for 20 minutes, 2-3 times per day.  Move your fingers often to avoid stiffness and to lessen swelling.  Raise (elevate) the injured area above the level of your heart while you are sitting or lying down. Activity  Rest your wrist. Do not do  things that cause pain.  Return to your normal activities as told by your health care provider. Ask your health care provider what activities are safe for you.  Do exercises as told by your health care provider. General instructions  Take over-the-counter and prescription medicines only as told by your health care provider.  Do not use any products that contain nicotine or tobacco, such as cigarettes and e-cigarettes. These can delay healing. If you need help quitting, ask your health care provider.  Ask your health care provider when it is safe to drive if you have a splint.  Keep all follow-up visits as told by your health care provider. This is important. Contact a health care provider  if:  Your pain, bruising, or swelling gets worse.  Your skin becomes red, gets a rash, or has open sores.  Your pain does not get better or it gets worse. Get help right away if:  You have a new or sudden sharp pain in the hand, arm, or wrist.  You have tingling or numbness in your hand.  Your fingers turn white, very red, or cold and blue.  You cannot move your fingers. This information is not intended to replace advice given to you by your health care provider. Make sure you discuss any questions you have with your health care provider. Document Released: 04/29/2014 Document Revised: 03/23/2016 Document Reviewed: 03/14/2016 Elsevier Interactive Patient Education  2017 [employer]

## 2017-08-04 NOTE — Assessment & Plan Note (Signed)
Likely sprain, x-ray today to rule out fracture. Continue ibuprofen for pain. Okay to continue with soft brace for now and RICE.

## 2017-08-04 NOTE — Progress Notes (Signed)
   Subjective:    Patient ID: Elizabeth Alvarado, female    DOB: 03-19-1966, 51 y.o.   MRN: [id number]  HPI The patient is a 51 YO female coming in for left wrist pain and swelling. She fell on Saturday night on a curb [place] She was not watching where she was going. She tried to catch herself and scrapped knees and palms of hands. Left wrist took more impact than right. Took ibuprofen after and went to bed.Swollen the next day. Has taken ibuprofen with relief of pain since that time and a soft wrist brace. She is able to move all directions but with some discomfort. No numbness or weakness.   Review of Systems  Constitutional: Negative.   Respiratory: Negative for cough, chest tightness and shortness of breath.   Cardiovascular: Negative for chest pain, palpitations and leg swelling.  Gastrointestinal: Negative for abdominal distention, abdominal pain, constipation, diarrhea, nausea and vomiting.  Musculoskeletal: Positive for arthralgias and joint swelling. Negative for gait problem and myalgias.  Skin: Negative.   Neurological: Negative.       Objective:   Physical Exam  Constitutional: She is oriented to person, place, and time. She appears well-developed and well-nourished.  HENT:  Head: Normocephalic and atraumatic.  Eyes: EOM are normal.  Neck: Normal range of motion.  Cardiovascular: Normal rate and regular rhythm.  Pulmonary/Chest: Effort normal and breath sounds normal. No respiratory distress. She has no wheezes. She has no rales.  Abdominal: Soft. Bowel sounds are normal. She exhibits no distension. There is no tenderness. There is no rebound.  Musculoskeletal: She exhibits tenderness. She exhibits no edema.  Mild tenderness in the wrist worse with radial deviation, no pain with movement of fingers or elbow.   Neurological: She is alert and oriented to person, place, and time. Coordination normal.  Skin: Skin is warm and dry.    Vitals:   08/04/17 1414  BP: 140/76  Pulse: 82  Temp: 97.9 F (36.6 C)  TempSrc: Oral  SpO2: 100%  Weight: 212 lb 4 oz (96.3 kg)  Height: 5\' 8"  (1.727 m)      Assessment & Plan:

## 2017-09-03 ENCOUNTER — Other Ambulatory Visit: Payer: Self-pay | Admitting: Obstetrics and Gynecology

## 2017-09-03 DIAGNOSIS — Z1231 Encounter for screening mammogram for malignant neoplasm of breast: Secondary | ICD-10-CM

## 2017-10-02 ENCOUNTER — Other Ambulatory Visit: Payer: Self-pay | Admitting: Internal Medicine

## 2017-10-15 ENCOUNTER — Ambulatory Visit
Admission: RE | Admit: 2017-10-15 | Discharge: 2017-10-15 | Disposition: A | Discharge status: Home / Self Care | Payer: Managed Care, Other (non HMO) | Source: Ambulatory Visit | Attending: Obstetrics and Gynecology | Admitting: Obstetrics and Gynecology

## 2017-10-15 DIAGNOSIS — Z1231 Encounter for screening mammogram for malignant neoplasm of breast: Secondary | ICD-10-CM

## 2017-10-31 ENCOUNTER — Other Ambulatory Visit: Payer: Self-pay | Admitting: Internal Medicine

## 2017-12-19 ENCOUNTER — Ambulatory Visit (INDEPENDENT_AMBULATORY_CARE_PROVIDER_SITE_OTHER): Payer: Managed Care, Other (non HMO) | Admitting: Internal Medicine

## 2017-12-19 ENCOUNTER — Encounter: Payer: Self-pay | Admitting: Internal Medicine

## 2017-12-19 DIAGNOSIS — J020 Streptococcal pharyngitis: Secondary | ICD-10-CM

## 2017-12-19 DIAGNOSIS — J029 Acute pharyngitis, unspecified: Secondary | ICD-10-CM | POA: Insufficient documentation

## 2017-12-19 MED ORDER — AMOXICILLIN-POT CLAVULANATE 875-125 MG PO TABS: 1.0000 | ORAL_TABLET | Freq: Two times a day (BID) | ORAL | 0 refills | Status: DC

## 2017-12-19 NOTE — Progress Notes (Signed)
   Subjective:    Patient ID: Elizabeth Alvarado, female    DOB: 07/27/1966, 52 y.o.   MRN: [id number]  HPI The patient is a 52 YO female coming in for sore throat and head congestion. Started about 3-4 days ago. Overall is worsening. Throat pain is severe and worse with swallowing. Denies fevers but some chills. Denies cough or SOB. Some ear pain as well. Taking otc benadryl and allergy medicine without relief. Denies sick contacts.   Review of Systems  Constitutional: Positive for activity change, appetite change and chills. Negative for fatigue, fever and unexpected weight change.  HENT: Positive for congestion, ear pain, postnasal drip, rhinorrhea and sore throat. Negative for ear discharge, sinus pressure, sinus pain, sneezing, tinnitus, trouble swallowing and voice change.   Eyes: Negative.   Respiratory: Negative for cough, chest tightness, shortness of breath and wheezing.   Cardiovascular: Negative.   Gastrointestinal: Negative.   Neurological: Negative.       Objective:   Physical Exam  Constitutional: She is oriented to person, place, and time. She appears well-developed and well-nourished.  HENT:  Head: Normocephalic and atraumatic.  Oropharynx with redness and white drainage tonsillar, nose with swollen turbinates, TMs normal bilaterally  Eyes: EOM are normal.  Neck: Normal range of motion. No thyromegaly present.  Cardiovascular: Normal rate and regular rhythm.  Pulmonary/Chest: Effort normal and breath sounds normal. No respiratory distress. She has no wheezes. She has no rales.  Abdominal: Soft.  Musculoskeletal: She exhibits tenderness.  Lymphadenopathy:    She has no cervical adenopathy.  Neurological: She is alert and oriented to person, place, and time.  Skin: Skin is warm and dry.   Vitals:   12/19/17 1445  BP: 140/82  Pulse: (!) 110  Temp: 98.3 F (36.8 C)  TempSrc: Oral  SpO2: 96%  Weight: 203 lb (92.1 kg)      Assessment & Plan:

## 2017-12-19 NOTE — Assessment & Plan Note (Signed)
centor criteria indicate treatment without swab. Rx for augmentin and continue otc allergy medicine.

## 2017-12-19 NOTE — Patient Instructions (Signed)
We have sent in augmentin to take 1 pill twice a day for 1 week.   Start taking zyrtec over the counter to help with drainage.

## 2018-02-09 LAB — HM PAP SMEAR

## 2018-04-02 ENCOUNTER — Ambulatory Visit (INDEPENDENT_AMBULATORY_CARE_PROVIDER_SITE_OTHER): Payer: Managed Care, Other (non HMO) | Admitting: Endocrinology

## 2018-04-02 ENCOUNTER — Encounter: Payer: Self-pay | Admitting: Endocrinology

## 2018-04-02 ENCOUNTER — Encounter

## 2018-04-02 ENCOUNTER — Other Ambulatory Visit (INDEPENDENT_AMBULATORY_CARE_PROVIDER_SITE_OTHER): Payer: Managed Care, Other (non HMO)

## 2018-04-02 DIAGNOSIS — E119 Type 2 diabetes mellitus without complications: Secondary | ICD-10-CM

## 2018-04-02 LAB — POCT GLYCOSYLATED HEMOGLOBIN (HGB A1C): Hemoglobin A1C: 6.9 % — AB (ref 4.0–5.6)

## 2018-04-02 LAB — TSH: TSH: 2.56 u[IU]/mL (ref 0.35–4.50)

## 2018-04-02 MED ORDER — LINAGLIPTIN-METFORMIN HCL ER 2.5-1000 MG PO TB24: 1.0000 | ORAL_TABLET | ORAL | 3 refills | Status: DC

## 2018-04-02 NOTE — Progress Notes (Signed)
Subjective:    Patient ID: Elizabeth Alvarado, female    DOB: 1966-07-30, 52 y.o.   MRN: [id number]  HPI pt is referred by Dr Okey Dupre, for diabetes.  Pt states DM was dx'ed in 2018 (she had GDM in 2001); she has mild if any neuropathy of the lower extremities; she is unaware of any associated chronic complications; he has never been on medication for DM; pt says her diet and exercise are good; she has never had pancreatitis, pancreatic surgery, severe hypoglycemia or DKA.  She has been on metformin, 500 mg BID, x 2 months.   Past Medical History:  Diagnosis Date  . DILATION AND CURETTAGE, HX OF 08/25/2007  . RENAL CALCULUS, HX OF 08/25/2007  . Ureteral stone 01/09/2011  . UTI'S, HX OF 08/25/2007    Past Surgical History:  Procedure Laterality Date  . BACK SURGERY    . BREAST BIOPSY Left   . CERVIX LESION DESTRUCTION  April '12   minor abnormal PAP; "hot ballon application"  . DILATION AND CURETTAGE OF UTERUS     hx of  . LITHOTRIPSY      Social History   Socioeconomic History  . Marital status: Married    Spouse name: Not on file  . Number of children: 1  . Years of education: 52  . Highest education level: Not on file  Occupational History  . 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    Employer: [employer]    Comment: [employer], [place] [employer] acctg.  Social Needs  . Financial resource strain: Not on file  . Food insecurity:    Worry: Not on file    Inability: Not on file  . Transportation needs:    Medical: Not on file    Non-medical: Not on file  Tobacco Use  . Smoking status: Never Smoker  . Smokeless tobacco: Never Used  Substance and Sexual Activity  . Alcohol use: Yes    Alcohol/week: 1.2 oz    Types: 2 drink(s) per week    Comment: rare drink of wine or beer once a month  . Drug use: No  . Sexual activity: Yes    Partners: Male    Comment: Monogamous relationship with husband.  Lifestyle  . Physical activity:    Days per week: Not on file   Minutes per session: Not on file  . Stress: Not on file  Relationships  . Social connections:    Talks on phone: Not on file    Gets together: Not on file    Attends religious service: Not on file    Active member of club or organization: Not on file    Attends meetings of clubs or organizations: Not on file    Relationship status: Not on file  . Intimate partner violence:    Fear of current or ex partner: Not on file    Emotionally abused: Not on file    Physically abused: Not on file    Forced sexual activity: Not on file  Other Topics Concern  . 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 1 son '01.Marriage is good.    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 irbesartan (AVAPRO) 150 MG tablet TAKE 1 TABLET BY MOUTH DAILY 30 tablet 5  . pantoprazole (PROTONIX) 40 MG tablet TAKE 1  TABLET(40 MG) BY MOUTH DAILY 30 tablet 3   No current facility-administered medications on file prior to visit.     No Known Allergies  Family History  Problem Relation Age of Onset  . Cancer Mother        breast cancer, 1943  . Hypertension Mother   . Cancer Father        prostate cancer, [id number]  . Hyperlipidemia Father   . Diabetes Neg Hx     BP 138/88 (BP Location: Right Arm, Patient Position: Sitting, Cuff Size: Normal)   Pulse 92   Ht 5\' 8"  (1.727 m)   Wt 190 lb 12.8 oz (86.5 kg)   SpO2 97%   BMI 29.01 kg/m     Review of Systems She has lost 20 lbs x 2 mos.  denies fatigue, blurry vision, headache, chest pain, sob, n/v, urinary frequency, muscle cramps, excessive diaphoresis, diarrhea, depression, cold intolerance, rhinorrhea, and easy bruising.        Objective:   Physical Exam VS: see vs page GEN: no distress HEAD: head: no deformity eyes: no periorbital swelling, no proptosis external nose and ears are  normal mouth: no lesion seen NECK: supple, thyroid is not enlarged CHEST WALL: no deformity LUNGS: clear to auscultation CV: reg rate and rhythm, no murmur ABD: abdomen is soft, nontender.  no hepatosplenomegaly.  not distended.  no hernia MUSCULOSKELETAL: muscle bulk and strength are grossly normal.  no obvious joint swelling.  gait is normal and steady EXTEMITIES: no deformity.  no ulcer on the feet.  feet are of normal color and temp.  no edema PULSES: dorsalis pedis intact bilat.  no carotid bruit NEURO:  cn 2-12 grossly intact.   readily moves all 4's.  sensation is intact to touch on the feet SKIN:  Normal texture and temperature.  No rash or suspicious lesion is visible.   NODES:  None palpable at the neck PSYCH: alert, well-oriented.  Does not appear anxious nor depressed.    Lab Results  Component Value Date   CREATININE 0.62 02/07/2017   BUN 13 02/07/2017   NA 137 02/07/2017   K 4.3 02/07/2017   CL 101 02/07/2017   CO2 29 02/07/2017   A1c=6.9%   outside test results are reviewed: A1c=9.9% (02/04/18)  I have reviewed outside records, and summarized: Pt was noted to have elevated a1c, and referred here.  She was rx'ed metformin.  She was also recently seen for culposcopy        Assessment & Plan:  Type 1 DM: improved on rx: she needs increased rx, if it can be done with a regimen that avoids or minimizes hypoglycemia.  Weight loss, new to me: prob due to metformin and her efforts, but we'll check TSH to be sure.   Patient Instructions  good diet and exercise significantly improve the control of your diabetes.  please let me know if you wish to be referred to a dietician.  high blood sugar is very risky to your health.  you should see an eye doctor and dentist every year.  It is very important to get all recommended vaccinations.  Controlling your blood pressure and cholesterol drastically reduces the damage diabetes does to your body.  Those who smoke should quit.   Please discuss these with your doctor.  check your blood sugar once a day.  vary the time of day when you check, between before the 3 meals, and at bedtime.  also check if you have symptoms of your  blood sugar being too high or too low.  please keep a record of the readings and bring it to your next appointment here (or you can bring the meter itself).  You can write it on any piece of paper.  please call us sooner if your blood sugar goes below 70, or if you have a lot of readings over 200. blood tests are requested for you today.  We'll let you know about the results.   I have sent a prescription to your pharmacy, to change the metformin to "Jentadueto." Please come back for a follow-up appointment in 2-3 months.      Bariatric Surgery You have so much to gain by losing weight.  You may have already tried every diet and exercise plan imaginable.  And, you may have sought advice from your family physician, too.   Sometimes, in spite of such diligent efforts, you may not be able to achieve long-term results by yourself.  In cases of severe obesity, bariatric or weight loss surgery is a proven method of achieving long-term weight control.  Our Services Our bariatric surgery programs offer our patients new hope and long-term weight-loss solution.  Since introducing our services in 2003, we have conducted more than 2,400 successful procedures.  Our program is designated as a Investment banker, corporate by the Metabolic and Bariatric Surgery Accreditation and Quality Improvement Program (MBSAQIP), a Child psychotherapist that sets rigorous patient safety and outcome standards.  Our program is also designated as a Engineer, manufacturing systems by [employer].   Our exceptional weight-loss surgery team specializes in diagnosis, treatment, follow-up care, and ongoing support for our patients with severe weight loss challenges.  We currently offer laparoscopic sleeve gastrectomy, gastric bypass, and  adjustable gastric band (LAP-BAND).    Attend our Bariatrics Seminar Choosing to undergo a bariatric procedure is a big decision, and one that should not be taken lightly.  You now have two options in how you learn about weight-loss surgery - in person or online.  Our objective is to ensure you have all of the information that you need to evaluate the advantages and obligations of this life changing procedure.  Please note that you are not alone in this process, and our experienced team is ready to assist and answer all of your questions.  There are several ways to register for a seminar (either on-line or in person): 1)  Call [phone] 2) Go on-line to [facility] and register for either type of seminar.  [email or web address]

## 2018-04-02 NOTE — Patient Instructions (Addendum)
good diet and exercise significantly improve the control of your diabetes.  please let me know if you wish to be referred to a dietician.  high blood sugar is very risky to your health.  you should see an eye doctor and dentist every year.  It is very important to get all recommended vaccinations.  Controlling your blood pressure and cholesterol drastically reduces the damage diabetes does to your body.  Those who smoke should quit.  Please discuss these with your doctor.  check your blood sugar once a day.  vary the time of day when you check, between before the 3 meals, and at bedtime.  also check if you have symptoms of your blood sugar being too high or too low.  please keep a record of the readings and bring it to your next appointment here (or you can bring the meter itself).  You can write it on any piece of paper.  please call us sooner if your blood sugar goes below 70, or if you have a lot of readings over 200. blood tests are requested for you today.  We'll let you know about the results.   I have sent a prescription to your pharmacy, to change the metformin to "Jentadueto." Please come back for a follow-up appointment in 2-3 months.      Bariatric Surgery You have so much to gain by losing weight.  You may have already tried every diet and exercise plan imaginable.  And, you may have sought advice from your family physician, too.   Sometimes, in spite of such diligent efforts, you may not be able to achieve long-term results by yourself.  In cases of severe obesity, bariatric or weight loss surgery is a proven method of achieving long-term weight control.  Our Services Our bariatric surgery programs offer our patients new hope and long-term weight-loss solution.  Since introducing our services in 2003, we have conducted more than 2,400 successful procedures.  Our program is designated as a Investment banker, corporateComprehensive Center by the Metabolic and Bariatric Surgery Accreditation and Quality Improvement  Program (MBSAQIP), a Child psychotherapistnational accrediting body that sets rigorous patient safety and outcome standards.  Our program is also designated as a Engineer, manufacturing systemsCenter of Excellence by [employer] insurance companies.   Our exceptional weight-loss surgery team specializes in diagnosis, treatment, follow-up care, and ongoing support for our patients with severe weight loss challenges.  We currently offer laparoscopic sleeve gastrectomy, gastric bypass, and adjustable gastric band (LAP-BAND).    Attend our Bariatrics Seminar Choosing to undergo a bariatric procedure is a big decision, and one that should not be taken lightly.  You now have two options in how you learn about weight-loss surgery - in person or online.  Our objective is to ensure you have all of the information that you need to evaluate the advantages and obligations of this life changing procedure.  Please note that you are not alone in this process, and our experienced team is ready to assist and answer all of your questions.  There are several ways to register for a seminar (either on-line or in person): 1)  Call [phone] 2) Go on-line to [facility] and register for either type of seminar.  [email or web address]

## 2018-04-10 ENCOUNTER — Ambulatory Visit: Payer: Managed Care, Other (non HMO) | Admitting: Endocrinology

## 2018-04-25 ENCOUNTER — Other Ambulatory Visit: Payer: Self-pay | Admitting: Internal Medicine

## 2018-05-26 ENCOUNTER — Other Ambulatory Visit: Payer: Self-pay | Admitting: Internal Medicine

## 2018-05-29 ENCOUNTER — Other Ambulatory Visit: Payer: Self-pay | Admitting: Internal Medicine

## 2018-06-01 ENCOUNTER — Other Ambulatory Visit: Payer: Self-pay | Admitting: Internal Medicine

## 2018-06-01 MED ORDER — IRBESARTAN 150 MG PO TABS: 150.0000 mg | ORAL_TABLET | Freq: Every day | ORAL | 0 refills | Status: DC

## 2018-06-01 NOTE — Telephone Encounter (Signed)
Copied from CRM [id number]. Topic: Quick Communication - Rx Refill/Question >> Jun 01, 2018 10:10 AM Angela NevinWilliams, Candice N wrote: Medication: irbesartan (AVAPRO) 150 MG tablet   Pt is requesting a partial refill of this medication until her appointment on 10/2.   Has the patient contacted their pharmacy? Yes, advised to contact office.  Preferred Pharmacy (with phone number or street name): [id number] - [place], [place] - [address] [phone] (Phone) [phone] (Fax)

## 2018-06-01 NOTE — Telephone Encounter (Signed)
Irbesartan refill Last Refill:04/27/18 # 30 0 refills Last OV: 02/07/17 PCP: Okey Duprerawford Pharmacy:[facility], [address], [place]  Pt has had her 30 day refill  appointment scheduled 06/10/18

## 2018-06-10 ENCOUNTER — Ambulatory Visit (INDEPENDENT_AMBULATORY_CARE_PROVIDER_SITE_OTHER): Payer: Managed Care, Other (non HMO) | Admitting: Internal Medicine

## 2018-06-10 ENCOUNTER — Encounter: Payer: Self-pay | Admitting: Internal Medicine

## 2018-06-10 ENCOUNTER — Other Ambulatory Visit (INDEPENDENT_AMBULATORY_CARE_PROVIDER_SITE_OTHER): Payer: Managed Care, Other (non HMO)

## 2018-06-10 VITALS — BP 120/70 | HR 77 | Temp 98.0°F | Ht 68.0 in | Wt 184.0 lb

## 2018-06-10 DIAGNOSIS — Z0000 Encounter for general adult medical examination without abnormal findings: Secondary | ICD-10-CM

## 2018-06-10 DIAGNOSIS — E119 Type 2 diabetes mellitus without complications: Secondary | ICD-10-CM | POA: Diagnosis not present

## 2018-06-10 DIAGNOSIS — I10 Essential (primary) hypertension: Secondary | ICD-10-CM

## 2018-06-10 LAB — COMPREHENSIVE METABOLIC PANEL
ALT: 23 U/L (ref 0–35)
AST: 18 U/L (ref 0–37)
Albumin: 4.1 g/dL (ref 3.5–5.2)
Alkaline Phosphatase: 107 U/L (ref 39–117)
BUN: 13 mg/dL (ref 6–23)
CHLORIDE: 103 meq/L (ref 96–112)
CO2: 31 mEq/L (ref 19–32)
Calcium: 9.8 mg/dL (ref 8.4–10.5)
Creatinine, Ser: 0.66 mg/dL (ref 0.40–1.20)
GFR: 100.31 mL/min (ref >60.00)
Glucose, Bld: 100 mg/dL — ABNORMAL HIGH (ref 70–99)
POTASSIUM: 4.4 meq/L (ref 3.5–5.1)
Sodium: 140 mEq/L (ref 135–145)
Total Bilirubin: 0.5 mg/dL (ref 0.2–1.2)
Total Protein: 7.5 g/dL (ref 6.0–8.3)

## 2018-06-10 LAB — LIPID PANEL
CHOLESTEROL: 189 mg/dL (ref 0–200)
HDL: 41.8 mg/dL (ref >39.00)
LDL CALC: 111 mg/dL — AB (ref 0–99)
NonHDL: 146.75
Total CHOL/HDL Ratio: 5
Triglycerides: 178 mg/dL — ABNORMAL HIGH (ref 0.0–149.0)
VLDL: 35.6 mg/dL (ref 0.0–40.0)

## 2018-06-10 LAB — CBC
HEMATOCRIT: 39.1 % (ref 36.0–46.0)
HEMOGLOBIN: 13.1 g/dL (ref 12.0–15.0)
MCHC: 33.6 g/dL (ref 30.0–36.0)
MCV: 85.4 fl (ref 78.0–100.0)
Platelets: 315 10*3/uL (ref 150.0–400.0)
RBC: 4.57 Mil/uL (ref 3.87–5.11)
RDW: 14.6 % (ref 11.5–15.5)
WBC: 11 10*3/uL — ABNORMAL HIGH (ref 4.0–10.5)

## 2018-06-10 LAB — MICROALBUMIN / CREATININE URINE RATIO
Creatinine,U: 86.7 mg/dL
MICROALB UR: 0.8 mg/dL (ref 0.0–1.9)
Microalb Creat Ratio: 0.9 mg/g (ref 0.0–30.0)

## 2018-06-10 MED ORDER — IRBESARTAN 150 MG PO TABS: 150.0000 mg | ORAL_TABLET | Freq: Every day | ORAL | 3 refills | Status: DC

## 2018-06-10 NOTE — Progress Notes (Signed)
   Subjective:    Patient ID: Elizabeth Alvarado, female    DOB: Nov 09, 1965, 52 y.o.   MRN: [id number]  HPI The patient is a 52 YO female coming in for physical. No new concerns.  PMH, [facility], social history reviewed and updated.   Review of Systems  Constitutional: Negative.   HENT: Negative.   Eyes: Negative.   Respiratory: Negative for cough, chest tightness and shortness of breath.   Cardiovascular: Negative for chest pain, palpitations and leg swelling.  Gastrointestinal: Negative for abdominal distention, abdominal pain, constipation, diarrhea, nausea and vomiting.  Musculoskeletal: Negative.   Skin: Negative.   Neurological: Negative.   Psychiatric/Behavioral: Negative.       Objective:   Physical Exam  Constitutional: She is oriented to person, place, and time. She appears well-developed and well-nourished.  HENT:  Head: Normocephalic and atraumatic.  Eyes: EOM are normal.  Neck: Normal range of motion.  Cardiovascular: Normal rate and regular rhythm.  Pulmonary/Chest: Effort normal and breath sounds normal. No respiratory distress. She has no wheezes. She has no rales.  Abdominal: Soft. Bowel sounds are normal. She exhibits no distension. There is no tenderness. There is no rebound.  Musculoskeletal: She exhibits no edema.  Neurological: She is alert and oriented to person, place, and time. Coordination normal.  Skin: Skin is warm and dry.  Psychiatric: She has a normal mood and affect.   Vitals:   06/10/18 1033  BP: 120/70  Pulse: 77  Temp: 98 F (36.7 C)  TempSrc: Oral  SpO2: 97%  Weight: 184 lb (83.5 kg)  Height: 5\' 8"  (1.727 m)      Assessment & Plan:

## 2018-06-10 NOTE — Patient Instructions (Signed)

## 2018-06-12 NOTE — Assessment & Plan Note (Signed)
Foot exam done, recent HgA1c okay. Checking microalbumin to creatinine ratio. Seeing endo for med adjustment.

## 2018-06-12 NOTE — Assessment & Plan Note (Signed)
Taking irbesartan 150 mg daily, checking CMP and adjust as needed.

## 2018-06-12 NOTE — Assessment & Plan Note (Signed)
Flu shot declines. Pneumonia declines. Shingrix counseled. Tetanus up to date. Colonoscopy cologuard ordered. Mammogram with gyn, pap smear with gyn and dexa not indicated. Counseled about sun safety and mole surveillance. Counseled about the dangers of distracted driving. Given 10 year screening recommendations.

## 2018-06-18 ENCOUNTER — Ambulatory Visit: Payer: Managed Care, Other (non HMO) | Admitting: Endocrinology

## 2018-06-22 ENCOUNTER — Ambulatory Visit: Payer: Managed Care, Other (non HMO) | Admitting: Endocrinology

## 2018-06-30 ENCOUNTER — Ambulatory Visit (INDEPENDENT_AMBULATORY_CARE_PROVIDER_SITE_OTHER): Payer: Managed Care, Other (non HMO) | Admitting: Endocrinology

## 2018-06-30 ENCOUNTER — Encounter: Payer: Self-pay | Admitting: Endocrinology

## 2018-06-30 VITALS — BP 140/84 | HR 64 | Ht 68.0 in | Wt 180.0 lb

## 2018-06-30 DIAGNOSIS — E119 Type 2 diabetes mellitus without complications: Secondary | ICD-10-CM | POA: Diagnosis not present

## 2018-06-30 LAB — POCT GLYCOSYLATED HEMOGLOBIN (HGB A1C): HEMOGLOBIN A1C: 5.6 % (ref 4.0–5.6)

## 2018-06-30 NOTE — Patient Instructions (Addendum)
check your blood sugar once a day.  vary the time of day when you check, between before the 3 meals, and at bedtime.  also check if you have symptoms of your blood sugar being too high or too low.  please keep a record of the readings and bring it to your next appointment here (or you can bring the meter itself).  You can write it on any piece of paper.  please call us sooner if your blood sugar goes below 70, or if you have a lot of readings over 200.  Please continue the same medication.  Please come back for a follow-up appointment in 6 months.  

## 2018-06-30 NOTE — Progress Notes (Signed)
Subjective:    Patient ID: Elizabeth Alvarado, female    DOB: [birth year], 52 y.o.   MRN: [id number]  HPI Pt returns for f/u of diabetes mellitus: DM type:  Dx'ed: 2018 Complications: none Therapy: metformin GDM: 2001 DKA: never Severe hypoglycemia: never Pancreatitis: never Pancreatic imaging: normal on 2012 CT Other: She declines weight loss surgery Interval history: pt states she feels well in general.  She takes medication as rx'ed.   Past Medical History:  Diagnosis Date  . DILATION AND CURETTAGE, HX OF 08/25/2007  . RENAL CALCULUS, HX OF 08/25/2007  . Ureteral stone 01/09/2011  . UTI'S, HX OF 08/25/2007    Past Surgical History:  Procedure Laterality Date  . BACK SURGERY    . BREAST BIOPSY Left   . CERVIX LESION DESTRUCTION  April '12   minor abnormal PAP; "hot ballon application"  . DILATION AND CURETTAGE OF UTERUS     hx of  . LITHOTRIPSY      Social History   Socioeconomic History  . Marital status: Married    Spouse name: Not on file  . Number of children: 1  . Years of education: 37  . Highest education level: Not on file  Occupational History  . 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    Employer: [employer]    Comment: [employer], [place] [employer] acctg.  Social Needs  . Financial resource strain: Not on file  . Food insecurity:    Worry: Not on file    Inability: Not on file  . Transportation needs:    Medical: Not on file    Non-medical: Not on file  Tobacco Use  . Smoking status: Never Smoker  . Smokeless tobacco: Never Used  Substance and Sexual Activity  . Alcohol use: Yes    Alcohol/week: 2.0 standard drinks    Types: 2 drink(s) per week    Comment: rare drink of wine or beer once a month  . Drug use: No  . Sexual activity: Yes    Partners: Male    Comment: Monogamous relationship with husband.  Lifestyle  . Physical activity:    Days per week: Not on file    Minutes per session: Not on file  . Stress: Not on file    Relationships  . Social connections:    Talks on phone: Not on file    Gets together: Not on file    Attends religious service: Not on file    Active member of club or organization: Not on file    Attends meetings of clubs or organizations: Not on file    Relationship status: Not on file  . Intimate partner violence:    Fear of current or ex partner: Not on file    Emotionally abused: Not on file    Physically abused: Not on file    Forced sexual activity: Not on file  Other Topics Concern  . 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 1 son '01.Marriage is good.    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 irbesartan (AVAPRO) 150 MG tablet Take 1 tablet (150 mg total) by mouth daily. 90 tablet 3  . linaGLIPtin-metFORMIN HCl ER (JENTADUETO XR) 2.01-999 MG TB24 Take 1 tablet by mouth every morning. 90 tablet 3  . pantoprazole (PROTONIX) 40 MG tablet TAKE  1 TABLET(40 MG) BY MOUTH DAILY 30 tablet 3   No current facility-administered medications on file prior to visit.     No Known Allergies  Family History  Problem Relation Age of Onset  . Cancer Mother        breast cancer, 1943  . Hypertension Mother   . Cancer Father        prostate cancer, 54  . Hyperlipidemia Father   . Diabetes Neg Hx     BP 140/84   Pulse 64   Ht 5\' 8"  (1.727 m)   Wt 180 lb (81.6 kg)   SpO2 98%   BMI 27.37 kg/m   Review of Systems She has lost weight, due to her efforts    Objective:   Physical Exam VITAL SIGNS:  See vs page GENERAL: no distress Pulses: dorsalis pedis intact bilat.   MSK: no deformity of the feet CV: no leg edema Skin:  no ulcer on the feet.  normal color and temp on the feet. Neuro: sensation is intact to touch on the feet  Lab Results  Component Value Date   HGBA1C 5.6 06/30/2018       Assessment & Plan:  Type 2 DM: well-controlled Weight loss, intentional: we'll follow.  Patient Instructions  check your blood sugar once a day.  vary the time of day when you check, between before the 3 meals, and at bedtime.  also check if you have symptoms of your blood sugar being too high or too low.  please keep a record of the readings and bring it to your next appointment here (or you can bring the meter itself).  You can write it on any piece of paper.  please call us sooner if your blood sugar goes below 70, or if you have a lot of readings over 200. Please continue the same medication.   Please come back for a follow-up appointment in 6 months.

## 2018-07-29 LAB — HM DIABETES EYE EXAM

## 2018-08-19 ENCOUNTER — Encounter: Payer: Self-pay | Admitting: Internal Medicine

## 2018-10-12 ENCOUNTER — Other Ambulatory Visit: Payer: Self-pay | Admitting: Obstetrics and Gynecology

## 2018-10-12 DIAGNOSIS — Z803 Family history of malignant neoplasm of breast: Secondary | ICD-10-CM

## 2018-11-16 ENCOUNTER — Other Ambulatory Visit: Payer: Self-pay | Admitting: Obstetrics and Gynecology

## 2018-11-16 DIAGNOSIS — Z1231 Encounter for screening mammogram for malignant neoplasm of breast: Secondary | ICD-10-CM

## 2018-12-10 ENCOUNTER — Ambulatory Visit: Payer: Managed Care, Other (non HMO)

## 2018-12-30 ENCOUNTER — Ambulatory Visit: Payer: Managed Care, Other (non HMO) | Admitting: Endocrinology

## 2019-01-11 LAB — COLOGUARD: Cologuard: NEGATIVE

## 2019-01-12 ENCOUNTER — Encounter: Payer: Self-pay | Admitting: Internal Medicine

## 2019-01-12 NOTE — Progress Notes (Signed)
Abstracted and sent to scan  

## 2019-01-19 ENCOUNTER — Telehealth: Payer: Self-pay | Admitting: Internal Medicine

## 2019-01-19 MED ORDER — IRBESARTAN 150 MG PO TABS: 150.0000 mg | ORAL_TABLET | Freq: Every day | ORAL | 1 refills | Status: DC

## 2019-01-19 NOTE — Telephone Encounter (Signed)
Rx sent to [facility] 

## 2019-01-19 NOTE — Telephone Encounter (Signed)
Copied from CRM [id number]. Topic: General - Other >> Jan 19, 2019  8:44 AM Leafy Ro wrote: Reason for CRM: pt is calling and has a new pharm [facility] rx. Pt needs new rx irbesartan 150 mg #90 with refills

## 2019-01-26 ENCOUNTER — Ambulatory Visit
Admission: RE | Admit: 2019-01-26 | Discharge: 2019-01-26 | Disposition: A | Discharge status: Home / Self Care | Payer: Managed Care, Other (non HMO) | Source: Ambulatory Visit | Attending: Obstetrics and Gynecology | Admitting: Obstetrics and Gynecology

## 2019-01-26 ENCOUNTER — Other Ambulatory Visit: Payer: Self-pay

## 2019-01-26 DIAGNOSIS — Z1231 Encounter for screening mammogram for malignant neoplasm of breast: Secondary | ICD-10-CM

## 2019-01-29 ENCOUNTER — Other Ambulatory Visit: Payer: Self-pay

## 2019-02-03 ENCOUNTER — Ambulatory Visit (INDEPENDENT_AMBULATORY_CARE_PROVIDER_SITE_OTHER): Payer: Managed Care, Other (non HMO) | Admitting: Endocrinology

## 2019-02-03 ENCOUNTER — Other Ambulatory Visit: Payer: Self-pay

## 2019-02-03 ENCOUNTER — Encounter: Payer: Self-pay | Admitting: Endocrinology

## 2019-02-03 VITALS — BP 142/84 | HR 87 | Temp 98.1°F | Wt 188.8 lb

## 2019-02-03 DIAGNOSIS — E119 Type 2 diabetes mellitus without complications: Secondary | ICD-10-CM

## 2019-02-03 LAB — POCT GLYCOSYLATED HEMOGLOBIN (HGB A1C): Hemoglobin A1C: 5.8 % — AB (ref 4.0–5.6)

## 2019-02-03 NOTE — Patient Instructions (Addendum)
Your blood pressure is high today.  Please see your primary care provider soon, to have it rechecked Please continue the same medications check your blood sugar twice a week.  vary the time of day when you check, between before the 3 meals, and at bedtime.  also check if you have symptoms of your blood sugar being too high or too low.  please keep a record of the readings and bring it to your next appointment here (or you can bring the meter itself).  You can write it on any piece of paper.  please call us sooner if your blood sugar goes below 70, or if you have a lot of readings over 200. Keep the blister covered with antibiotic ointment and a bandaid, until it heals. Please come back for a follow-up appointment in 6 months.

## 2019-02-03 NOTE — Progress Notes (Signed)
Subjective:    Patient ID: Elizabeth Alvarado, female    DOB: 1965/10/06, 53 y.o.   MRN: [id number]  HPI Pt returns for f/u of diabetes mellitus: DM type: 2 Dx'ed: 2018 Complications: none Therapy: janumet GDM: 2001 DKA: never Severe hypoglycemia: never Pancreatitis: never Pancreatic imaging: normal on 2012 CT Other: She declines weight loss surgery; she has never been on insulin.   Interval history: pt states she feels well in general.  She takes medication as rx'ed.   Past Medical History:  Diagnosis Date  . DILATION AND CURETTAGE, HX OF 08/25/2007  . RENAL CALCULUS, HX OF 08/25/2007  . Ureteral stone 01/09/2011  . UTI'S, HX OF 08/25/2007    Past Surgical History:  Procedure Laterality Date  . BACK SURGERY    . BREAST BIOPSY Left   . CERVIX LESION DESTRUCTION  April '12   minor abnormal PAP; "hot ballon application"  . DILATION AND CURETTAGE OF UTERUS     hx of  . LITHOTRIPSY      Social History   Socioeconomic History  . Marital status: Married    Spouse name: Not on file  . Number of children: 1  . Years of education: 43  . Highest education level: Not on file  Occupational History  . 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    Employer: [employer]    Comment: [employer], [place] [employer] acctg.  Social Needs  . Financial resource strain: Not on file  . Food insecurity:    Worry: Not on file    Inability: Not on file  . Transportation needs:    Medical: Not on file    Non-medical: Not on file  Tobacco Use  . Smoking status: Never Smoker  . Smokeless tobacco: Never Used  Substance and Sexual Activity  . Alcohol use: Yes    Alcohol/week: 2.0 standard drinks    Types: 2 drink(s) per week    Comment: rare drink of wine or beer once a month  . Drug use: No  . Sexual activity: Yes    Partners: Male    Comment: Monogamous relationship with husband.  Lifestyle  . Physical activity:    Days per week: Not on file    Minutes per session: Not  on file  . Stress: Not on file  Relationships  . Social connections:    Talks on phone: Not on file    Gets together: Not on file    Attends religious service: Not on file    Active member of club or organization: Not on file    Attends meetings of clubs or organizations: Not on file    Relationship status: Not on file  . Intimate partner violence:    Fear of current or ex partner: Not on file    Emotionally abused: Not on file    Physically abused: Not on file    Forced sexual activity: Not on file  Other Topics Concern  . 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 1 son '01.Marriage is good.    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 irbesartan (AVAPRO) 150 MG tablet Take 1 tablet (150 mg total) by mouth daily. 90 tablet 1  . linaGLIPtin-metFORMIN HCl ER (JENTADUETO XR) 2.01-999 MG TB24 Take 1 tablet by mouth every morning. 90 tablet 3  .  pantoprazole (PROTONIX) 40 MG tablet TAKE 1 TABLET(40 MG) BY MOUTH DAILY 30 tablet 3   No current facility-administered medications on file prior to visit.     No Known Allergies  Family History  Problem Relation Age of Onset  . Cancer Mother        breast cancer, 1943  . Hypertension Mother   . Cancer Father        prostate cancer, [id number]  . Hyperlipidemia Father   . Diabetes Neg Hx     BP (!) 142/84 (BP Location: Left Arm, Patient Position: Sitting, Cuff Size: Normal)   Pulse 87   Temp 98.1 F (36.7 C) (Oral)   Wt 188 lb 12.8 oz (85.6 kg)   SpO2 98%   BMI 28.71 kg/m    Review of Systems She denies hypoglycemia.      Objective:   Physical Exam VITAL SIGNS:  See vs page GENERAL: no distress Pulses: dorsalis pedis intact bilat.   MSK: no deformity of the feet CV: no leg edema Skin:  no ulcer on the feet, but there is a blister on the dorsal aspect of  the left foot.  normal color and temp on the feet. Neuro: sensation is intact to touch on the feet.     Lab Results  Component Value Date   HGBA1C 5.8 (A) 02/03/2019       Assessment & Plan:  HTN: is noted today.  Type 2 DM: well-controlled.   Blister, new.  Mild.   Patient Instructions  Your blood pressure is high today.  Please see your primary care provider soon, to have it rechecked Please continue the same medications check your blood sugar twice a week.  vary the time of day when you check, between before the 3 meals, and at bedtime.  also check if you have symptoms of your blood sugar being too high or too low.  please keep a record of the readings and bring it to your next appointment here (or you can bring the meter itself).  You can write it on any piece of paper.  please call us sooner if your blood sugar goes below 70, or if you have a lot of readings over 200. Keep the blister covered with antibiotic ointment and a bandaid, until it heals. Please come back for a follow-up appointment in 6 months.

## 2019-03-05 ENCOUNTER — Other Ambulatory Visit: Payer: Managed Care, Other (non HMO)

## 2019-03-22 ENCOUNTER — Other Ambulatory Visit: Payer: Self-pay | Admitting: Endocrinology

## 2019-04-19 LAB — HM DIABETES EYE EXAM

## 2019-06-11 ENCOUNTER — Encounter: Payer: Self-pay | Admitting: Gastroenterology

## 2019-06-21 ENCOUNTER — Other Ambulatory Visit: Payer: Self-pay

## 2019-06-21 MED ORDER — IRBESARTAN 150 MG PO TABS: 150.0000 mg | ORAL_TABLET | Freq: Every day | ORAL | 0 refills | Status: DC

## 2019-06-30 ENCOUNTER — Encounter: Payer: Self-pay | Admitting: Gastroenterology

## 2019-06-30 ENCOUNTER — Ambulatory Visit (INDEPENDENT_AMBULATORY_CARE_PROVIDER_SITE_OTHER): Payer: Managed Care, Other (non HMO) | Admitting: Gastroenterology

## 2019-06-30 ENCOUNTER — Other Ambulatory Visit: Payer: Self-pay

## 2019-06-30 VITALS — BP 120/76 | HR 72 | Temp 97.7°F | Ht 68.0 in | Wt 194.8 lb

## 2019-06-30 DIAGNOSIS — Z1159 Encounter for screening for other viral diseases: Secondary | ICD-10-CM | POA: Diagnosis not present

## 2019-06-30 DIAGNOSIS — R1310 Dysphagia, unspecified: Secondary | ICD-10-CM

## 2019-06-30 MED ORDER — PANTOPRAZOLE SODIUM 40 MG PO TBEC: DELAYED_RELEASE_TABLET | ORAL | 11 refills | Status: DC

## 2019-06-30 NOTE — Patient Instructions (Signed)
We have sent the following medications to your pharmacy for you to pick up at your convenience: pantoprazole.   You have been scheduled for an endoscopy. Please follow written instructions given to you at your visit today. If you use inhalers (even only as needed), please bring them with you on the day of your procedure. Your physician has requested that you go to www.startemmi.com and enter the access code given to you at your visit today. This web site gives a general overview about your procedure. However, you should still follow specific instructions given to you by our office regarding your preparation for the procedure.  Normal BMI (Body Mass Index- based on height and weight) is between 19 and 25. Your BMI today is Body mass index is 29.62 kg/m. Marland Kitchen Please consider follow up  regarding your BMI with your Primary Care Provider.  Thank you for choosing me and [facility].  Pricilla Riffle. Dagoberto Ligas., MD., Marval Regal

## 2019-06-30 NOTE — Progress Notes (Signed)
History of Present Illness: This is a 53 year old female referred by Myrlene Broker, * for the evaluation of dysphagia.  Patient relates a 3 to 4-year history of intermittent solid food dysphagia primarily with steak and chicken.  She has self-induced vomiting to clear food impaction on several occasions.  In October she had a prolonged episode of a food impaction that lasted about 36 to 40 hours.  She tried to induce vomiting but it was ineffective.  She was about to go to the emergency department when the bolus passed.  She takes pantoprazole intermittently for intermittent reflux symptoms.  She is taking pantoprazole daily since her last episode of dysphagia. Denies weight loss, abdominal pain, constipation, diarrhea, change in stool caliber, melena, hematochezia, nausea, vomiting, chest pain.    No Known Allergies Outpatient Medications Prior to Visit  Medication Sig Dispense Refill  . irbesartan (AVAPRO) 150 MG tablet Take 1 tablet (150 mg total) by mouth daily. Need appointment for further refills 30 tablet 0  . JENTADUETO XR 2.01-999 MG TB24 TAKE 1 TABLET BY MOUTH EVERY MORNING 90 tablet 3  . pantoprazole (PROTONIX) 40 MG tablet TAKE 1 TABLET(40 MG) BY MOUTH DAILY 30 tablet 3   No facility-administered medications prior to visit.    Past Medical History:  Diagnosis Date  . DILATION AND CURETTAGE, HX OF 08/25/2007  . RENAL CALCULUS, HX OF 08/25/2007  . Ureteral stone 01/09/2011  . UTI'S, HX OF 08/25/2007   Past Surgical History:  Procedure Laterality Date  . BACK SURGERY    . BREAST BIOPSY Left   . CERVIX LESION DESTRUCTION  April '12   minor abnormal PAP; "hot ballon application"  . DILATION AND CURETTAGE OF UTERUS     hx of  . LITHOTRIPSY     Social History   Socioeconomic History  . Marital status: Married    Spouse name: Not on file  . Number of children: 1  . Years of education: 38  . Highest education level: Not on file  Occupational History  .  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    Employer: [employer]    Comment: [employer], [place] [employer] acctg.  Social Needs  . Financial resource strain: Not on file  . Food insecurity    Worry: Not on file    Inability: Not on file  . Transportation needs    Medical: Not on file    Non-medical: Not on file  Tobacco Use  . Smoking status: Never Smoker  . Smokeless tobacco: Never Used  Substance and Sexual Activity  . Alcohol use: Yes    Alcohol/week: 2.0 standard drinks    Types: 2 drink(s) per week    Comment: rare drink of wine or beer once a month  . Drug use: No  . Sexual activity: Yes    Partners: Male    Comment: Monogamous relationship with husband.  Lifestyle  . Physical activity    Days per week: Not on file    Minutes per session: Not on file  . Stress: Not on file  Relationships  . Social Musician on phone: Not on file    Gets together: Not on file    Attends religious service: Not on file    Active member of club or organization: Not on file    Attends meetings of clubs or organizations: Not on file    Relationship status: Not on file  Other Topics Concern  . 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 Married '95. 1 son '01.Marriage is good.    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 Life is good   Family History  Problem Relation Age of Onset  . Cancer Mother        breast cancer, 1943  . Hypertension Mother   . Cancer Father        prostate cancer, 38  . Hyperlipidemia Father   . Diabetes Neg Hx        Review of Systems: Pertinent positive and negative review of systems were noted in the above HPI section. All other review of systems were otherwise negative.   Physical Exam: General: Well developed, well nourished, no acute distress Head: Normocephalic and atraumatic Eyes:  sclerae anicteric, EOMI Ears: Normal auditory acuity Mouth: No deformity or lesions Neck:  Supple, no masses or thyromegaly Lungs: Clear throughout to auscultation Heart: Regular rate and rhythm; no murmurs, rubs or bruits Abdomen: Soft, non tender and non distended. No masses, hepatosplenomegaly or hernias noted. Normal Bowel sounds Rectal: Not done  Musculoskeletal: Symmetrical with no gross deformities  Skin: No lesions on visible extremities Pulses:  Normal pulses noted Extremities: No clubbing, cyanosis, edema or deformities noted Neurological: Alert oriented x 4, grossly nonfocal Cervical Nodes:  No significant cervical adenopathy Inguinal Nodes: No significant inguinal adenopathy Psychological:  Alert and cooperative. Normal mood and affect   Assessment and Recommendations:  1. Dysphagia to solids, intermittent.  Suspected esophageal stricture.  Rule out esophagitis and less likely neoplasm.  Follow standard antireflux measures and continue pantoprazole 40 mg daily long-term.  Schedule EGD with possible dilation. The risks (including bleeding, perforation, infection, missed lesions, medication reactions and possible hospitalization or surgery if complications occur), benefits, and alternatives to endoscopy with possible biopsy and possible dilation were discussed with the patient and they consent to proceed.    2. CRC screening, average risk. Negative Cologuard in 12/2018.   cc: Hoyt Koch, MD [address] [place],  [place] [id number]

## 2019-07-14 ENCOUNTER — Encounter: Payer: Self-pay | Admitting: Gastroenterology

## 2019-07-15 ENCOUNTER — Other Ambulatory Visit: Payer: Self-pay | Admitting: Gastroenterology

## 2019-07-15 LAB — SARS CORONAVIRUS 2 (TAT 6-24 HRS): SARS Coronavirus 2: NEGATIVE

## 2019-07-19 ENCOUNTER — Encounter: Payer: Self-pay | Admitting: Gastroenterology

## 2019-07-19 ENCOUNTER — Other Ambulatory Visit: Payer: Self-pay

## 2019-07-19 ENCOUNTER — Ambulatory Visit (AMBULATORY_SURGERY_CENTER): Payer: Managed Care, Other (non HMO) | Admitting: Gastroenterology

## 2019-07-19 VITALS — BP 143/79 | HR 71 | Temp 97.9°F | Resp 12 | Ht 68.0 in | Wt 194.0 lb

## 2019-07-19 DIAGNOSIS — R1310 Dysphagia, unspecified: Secondary | ICD-10-CM

## 2019-07-19 DIAGNOSIS — R1319 Other dysphagia: Secondary | ICD-10-CM

## 2019-07-19 DIAGNOSIS — K449 Diaphragmatic hernia without obstruction or gangrene: Secondary | ICD-10-CM

## 2019-07-19 DIAGNOSIS — K222 Esophageal obstruction: Secondary | ICD-10-CM | POA: Diagnosis not present

## 2019-07-19 MED ORDER — SODIUM CHLORIDE 0.9 % IV SOLN: 500.0000 mL | INTRAVENOUS | Status: DC

## 2019-07-19 NOTE — Progress Notes (Signed)
Called to room to assist during endoscopic procedure.  Patient ID and intended procedure confirmed with present staff. Received instructions for my participation in the procedure from the performing physician.  

## 2019-07-19 NOTE — Progress Notes (Signed)
A and O x3. Report to RN. Tolerated MAC anesthesia well.Teeth unchanged after procedure.

## 2019-07-19 NOTE — Patient Instructions (Addendum)
Handouts given:   hiatal hernia, stricture anti reflux diet    YOU HAD AN ENDOSCOPIC PROCEDURE TODAY AT [facility]:   Refer to the procedure report that was given to you for any specific questions about what was found during the examination.  If the procedure report does not answer your questions, please call your gastroenterologist to clarify.  If you requested that your care partner not be given the details of your procedure findings, then the procedure report has been included in a sealed envelope for you to review at your convenience later. Handouts given : GERD( antireflux), hiatal hernia, sctricture     YOU SHOULD EXPECT: Some feelings of bloating in the abdomen. Passage of more gas than usual.  Walking can help get rid of the air that was put into your GI tract during the procedure and reduce the bloating. If you had a lower endoscopy (such as a colonoscopy or flexible sigmoidoscopy) you may notice spotting of blood in your stool or on the toilet paper. If you underwent a bowel prep for your procedure, you may not have a normal bowel movement for a few days.  Please Note:  You might notice some irritation and congestion in your nose or some drainage.  This is from the oxygen used during your procedure.  There is no need for concern and it should clear up in a day or so.  SYMPTOMS TO REPORT IMMEDIATELY:    Following upper endoscopy (EGD)  Vomiting of blood or coffee ground material  New chest pain or pain under the shoulder blades  Painful or persistently difficult swallowing  New shortness of breath  Fever of 100F or higher  Black, tarry-looking stools  For urgent or emergent issues, a gastroenterologist can be reached at any hour by calling [phone].   DIET:  We do recommend a small meal at first, but then you may proceed to your regular diet.  Drink plenty of fluids but you should avoid alcoholic beverages for 24 hours.  ACTIVITY:  You should plan to  take it easy for the rest of today and you should NOT DRIVE or use heavy machinery until tomorrow (because of the sedation medicines used during the test).    FOLLOW UP: Our staff will call the number listed on your records 48-72 hours following your procedure to check on you and address any questions or concerns that you may have regarding the information given to you following your procedure. If we do not reach you, we will leave a message.  We will attempt to reach you two times.  During this call, we will ask if you have developed any symptoms of COVID 19. If you develop any symptoms (ie: fever, flu-like symptoms, shortness of breath, cough etc.) before then, please call [phone].  If you test positive for Covid 19 in the 2 weeks post procedure, please call and report this information to [place].    If any biopsies were taken you will be contacted by phone or by letter within the next 1-3 weeks.  Please call us at [phone] if you have not heard about the biopsies in 3 weeks.    SIGNATURES/CONFIDENTIALITY: You and/or your care partner have signed paperwork which will be entered into your electronic medical record.  These signatures attest to the fact that that the information above on your After Visit Summary has been reviewed and is understood.  Full responsibility of the confidentiality of this discharge information lies with you and/or  your care-partner. 

## 2019-07-19 NOTE — Progress Notes (Signed)
Vs by Isabella. Temp by LC.

## 2019-07-19 NOTE — Op Note (Signed)
[facility] Patient Name: Elizabeth Alvarado Procedure Date: 07/19/2019 8:57 AM MRN: [id number] Endoscopist: Meryl DareMalcolm T Basha Krygier , MD Age: [age] Referring MD:  Date of Birth: [birth year] Gender: Female Account #: [id number] Procedure:                Upper GI endoscopy Indications:              Dysphagia Medicines:                Monitored Anesthesia Care Procedure:                Pre-Anesthesia Assessment:                           - Prior to the procedure, a History and Physical                            was performed, and patient medications and                            allergies were reviewed. The patient's tolerance of                            previous anesthesia was also reviewed. The risks                            and benefits of the procedure and the sedation                            options and risks were discussed with the patient.                            All questions were answered, and informed consent                            was obtained. Prior Anticoagulants: The patient has                            taken no previous anticoagulant or antiplatelet                            agents. ASA Grade Assessment: II - A patient with                            mild systemic disease. After reviewing the risks                            and benefits, the patient was deemed in                            satisfactory condition to undergo the procedure.                           After obtaining informed consent, the endoscope was  passed under direct vision. Throughout the                            procedure, the patient's blood pressure, pulse, and                            oxygen saturations were monitored continuously. The                            Endoscope was introduced through the mouth, and                            advanced to the second part of duodenum. The upper                            GI endoscopy was accomplished without  difficulty.                            The patient tolerated the procedure well. Scope In: Scope Out: Findings:                 One benign-appearing, intrinsic moderate stenosis                            was found at the gastroesophageal junction. This                            stenosis measured 1.3 cm (inner diameter). The                            stenosis was traversed. A guidewire was placed and                            the scope was withdrawn. Dilations were performed                            with Savary dilators with mild resistance at 14 mm,                            15 mm and 16 mm. No heme.                           A single area of ectopic gastric mucosa was found                            in the proximal esophagus.                           The exam of the esophagus was otherwise normal.                           A small hiatal hernia was present.  The exam of the stomach was otherwise normal.                           The duodenal bulb and second portion of the                            duodenum were normal. Complications:            No immediate complications. Estimated Blood Loss:     Estimated blood loss: none. Impression:               - Benign-appearing esophageal stenosis. Dilated.                           - Ectopic gastric mucosa in the proximal esophagus.                           - Small hiatal hernia.                           - Normal duodenal bulb and second portion of the                            duodenum.                           - No specimens collected. Recommendation:           - Patient has a contact number available for                            emergencies. The signs and symptoms of potential                            delayed complications were discussed with the                            patient. Return to normal activities tomorrow.                            Written discharge instructions were provided to the                             patient.                           - Clear liquid diet for 2 hours, then advance as                            tolerated to soft diet today. Resume prior diet                            tomorrow.                           - Follow antireflux measures long term.                           -  Continue present medications.                           - Return to GI office in 6 weeks. Meryl Dare, MD 07/19/2019 9:28:49 AM This report has been signed electronically.

## 2019-07-20 ENCOUNTER — Other Ambulatory Visit: Payer: Self-pay | Admitting: Internal Medicine

## 2019-07-21 ENCOUNTER — Telehealth: Payer: Self-pay

## 2019-07-21 ENCOUNTER — Telehealth: Payer: Self-pay | Admitting: *Deleted

## 2019-07-21 NOTE — Telephone Encounter (Signed)
Left message on f/u call 

## 2019-07-21 NOTE — Telephone Encounter (Signed)
1. Have you developed a fever since your procedure? no  2.   Have you had an respiratory symptoms (SOB or cough) since your procedure? no  3.   Have you tested positive for COVID 19 since your procedure no  4.   Have you had any family members/close contacts diagnosed with the COVID 19 since your procedure?  no   If yes to any of these questions please route to Joylene John, RN and Alphonsa Gin, Therapist, sports.  Follow up Call-  Call back number 07/19/2019  Post procedure Call Back phone  # [phone]  Permission to leave phone message Yes  Some recent data might be hidden     Patient questions:  Do you have a fever, pain , or abdominal swelling? No. Pain Score  0 *  Have you tolerated food without any problems? Yes.    Have you been able to return to your normal activities? Yes.    Do you have any questions about your discharge instructions: Diet   No. Medications  No. Follow up visit  No.  Do you have questions or concerns about your Care? No.  Actions: * If pain score is 4 or above: No action needed, pain <4.

## 2019-07-29 ENCOUNTER — Ambulatory Visit (INDEPENDENT_AMBULATORY_CARE_PROVIDER_SITE_OTHER): Payer: Managed Care, Other (non HMO) | Admitting: Internal Medicine

## 2019-07-29 ENCOUNTER — Other Ambulatory Visit (INDEPENDENT_AMBULATORY_CARE_PROVIDER_SITE_OTHER): Payer: Managed Care, Other (non HMO)

## 2019-07-29 ENCOUNTER — Other Ambulatory Visit: Payer: Self-pay

## 2019-07-29 ENCOUNTER — Encounter: Payer: Self-pay | Admitting: Internal Medicine

## 2019-07-29 VITALS — BP 120/84 | HR 68 | Temp 98.3°F | Ht 68.0 in | Wt 193.0 lb

## 2019-07-29 DIAGNOSIS — Z23 Encounter for immunization: Secondary | ICD-10-CM

## 2019-07-29 DIAGNOSIS — Z0000 Encounter for general adult medical examination without abnormal findings: Secondary | ICD-10-CM

## 2019-07-29 DIAGNOSIS — E1169 Type 2 diabetes mellitus with other specified complication: Secondary | ICD-10-CM | POA: Diagnosis not present

## 2019-07-29 DIAGNOSIS — E781 Pure hyperglyceridemia: Secondary | ICD-10-CM | POA: Diagnosis not present

## 2019-07-29 DIAGNOSIS — I10 Essential (primary) hypertension: Secondary | ICD-10-CM | POA: Diagnosis not present

## 2019-07-29 LAB — LIPID PANEL
Cholesterol: 207 mg/dL — ABNORMAL HIGH (ref 0–200)
HDL: 46.7 mg/dL (ref >39.00)
LDL Cholesterol: 128 mg/dL — ABNORMAL HIGH (ref 0–99)
NonHDL: 160.57
Total CHOL/HDL Ratio: 4
Triglycerides: 163 mg/dL — ABNORMAL HIGH (ref 0.0–149.0)
VLDL: 32.6 mg/dL (ref 0.0–40.0)

## 2019-07-29 LAB — CBC
HCT: 41 % (ref 36.0–46.0)
Hemoglobin: 13.8 g/dL (ref 12.0–15.0)
MCHC: 33.8 g/dL (ref 30.0–36.0)
MCV: 88.1 fl (ref 78.0–100.0)
Platelets: 319 10*3/uL (ref 150.0–400.0)
RBC: 4.65 Mil/uL (ref 3.87–5.11)
RDW: 13.3 % (ref 11.5–15.5)
WBC: 7.5 10*3/uL (ref 4.0–10.5)

## 2019-07-29 LAB — COMPREHENSIVE METABOLIC PANEL
ALT: 17 U/L (ref 0–35)
AST: 14 U/L (ref 0–37)
Albumin: 4.4 g/dL (ref 3.5–5.2)
Alkaline Phosphatase: 103 U/L (ref 39–117)
BUN: 14 mg/dL (ref 6–23)
CO2: 28 mEq/L (ref 19–32)
Calcium: 9.5 mg/dL (ref 8.4–10.5)
Chloride: 104 mEq/L (ref 96–112)
Creatinine, Ser: 0.7 mg/dL (ref 0.40–1.20)
GFR: 87.79 mL/min (ref >60.00)
Glucose, Bld: 121 mg/dL — ABNORMAL HIGH (ref 70–99)
Potassium: 4.1 mEq/L (ref 3.5–5.1)
Sodium: 141 mEq/L (ref 135–145)
Total Bilirubin: 0.5 mg/dL (ref 0.2–1.2)
Total Protein: 7.5 g/dL (ref 6.0–8.3)

## 2019-07-29 LAB — HEMOGLOBIN A1C: Hgb A1c MFr Bld: 6 % (ref 4.6–6.5)

## 2019-07-29 LAB — MICROALBUMIN / CREATININE URINE RATIO
Creatinine,U: 111.6 mg/dL
Microalb Creat Ratio: 1 mg/g (ref 0.0–30.0)
Microalb, Ur: 1.1 mg/dL (ref 0.0–1.9)

## 2019-07-29 MED ORDER — IRBESARTAN 150 MG PO TABS: 150.0000 mg | ORAL_TABLET | Freq: Every day | ORAL | 3 refills | Status: DC

## 2019-07-29 NOTE — Progress Notes (Signed)
   Subjective:   Patient ID: Elizabeth Alvarado, female    DOB: September 01, 1966, 53 y.o.   MRN: [id number]  HPI The patient is a 53 YO female coming in for physical.   PMH, [place], social history reviewed and updated  Review of Systems  Constitutional: Negative.   HENT: Negative.   Eyes: Negative.   Respiratory: Negative for cough, chest tightness and shortness of breath.   Cardiovascular: Negative for chest pain, palpitations and leg swelling.  Gastrointestinal: Negative for abdominal distention, abdominal pain, constipation, diarrhea, nausea and vomiting.  Musculoskeletal: Negative.   Skin: Negative.   Neurological: Negative.   Psychiatric/Behavioral: Negative.     Objective:  Physical Exam Constitutional:      Appearance: She is well-developed.  HENT:     Head: Normocephalic and atraumatic.  Neck:     Musculoskeletal: Normal range of motion.  Cardiovascular:     Rate and Rhythm: Normal rate and regular rhythm.  Pulmonary:     Effort: Pulmonary effort is normal. No respiratory distress.     Breath sounds: Normal breath sounds. No wheezing or rales.  Abdominal:     General: Bowel sounds are normal. There is no distension.     Palpations: Abdomen is soft.     Tenderness: There is no abdominal tenderness. There is no rebound.  Skin:    General: Skin is warm and dry.     Comments: Foot exam done  Neurological:     Mental Status: She is alert and oriented to person, place, and time.     Coordination: Coordination normal.     Vitals:   07/29/19 0748  BP: 120/84  Pulse: 68  Temp: 98.3 F (36.8 C)  TempSrc: Oral  SpO2: 98%  Weight: 193 lb (87.5 kg)  Height: 5\' 8"  (1.727 m)    Assessment & Plan:  Pneumonia 23 and shingrix given at visit

## 2019-07-29 NOTE — Patient Instructions (Signed)
Health Maintenance, Female Adopting a healthy lifestyle and getting preventive care are important in promoting health and wellness. Ask your health care provider about:  The right schedule for you to have regular tests and exams.  Things you can do on your own to prevent diseases and keep yourself healthy. What should I know about diet, weight, and exercise? Eat a healthy diet   Eat a diet that includes plenty of vegetables, fruits, low-fat dairy products, and lean protein.  Do not eat a lot of foods that are high in solid fats, added sugars, or sodium. Maintain a healthy weight Body mass index (BMI) is used to identify weight problems. It estimates body fat based on height and weight. Your health care provider can help determine your BMI and help you achieve or maintain a healthy weight. Get regular exercise Get regular exercise. This is one of the most important things you can do for your health. Most adults should:  Exercise for at least 150 minutes each week. The exercise should increase your heart rate and make you sweat (moderate-intensity exercise).  Do strengthening exercises at least twice a week. This is in addition to the moderate-intensity exercise.  Spend less time sitting. Even light physical activity can be beneficial. Watch cholesterol and blood lipids Have your blood tested for lipids and cholesterol at 52 years of age, then have this test every 5 years. Have your cholesterol levels checked more often if:  Your lipid or cholesterol levels are high.  You are older than 52 years of age.  You are at high risk for heart disease. What should I know about cancer screening? Depending on your health history and family history, you may need to have cancer screening at various ages. This may include screening for:  Breast cancer.  Cervical cancer.  Colorectal cancer.  Skin cancer.  Lung cancer. What should I know about heart disease, diabetes, and high blood  pressure? Blood pressure and heart disease  High blood pressure causes heart disease and increases the risk of stroke. This is more likely to develop in people who have high blood pressure readings, are of African descent, or are overweight.  Have your blood pressure checked: ? Every 3-5 years if you are 18-39 years of age. ? Every year if you are 40 years old or older. Diabetes Have regular diabetes screenings. This checks your fasting blood sugar level. Have the screening done:  Once every three years after age 40 if you are at a normal weight and have a low risk for diabetes.  More often and at a younger age if you are overweight or have a high risk for diabetes. What should I know about preventing infection? Hepatitis B If you have a higher risk for hepatitis B, you should be screened for this virus. Talk with your health care provider to find out if you are at risk for hepatitis B infection. Hepatitis C Testing is recommended for:  Everyone born from 1945 through 1965.  Anyone with known risk factors for hepatitis C. Sexually transmitted infections (STIs)  Get screened for STIs, including gonorrhea and chlamydia, if: ? You are sexually active and are younger than 52 years of age. ? You are older than 52 years of age and your health care provider tells you that you are at risk for this type of infection. ? Your sexual activity has changed since you were last screened, and you are at increased risk for chlamydia or gonorrhea. Ask your health care provider if   you are at risk.  Ask your health care provider about whether you are at high risk for HIV. Your health care provider may recommend a prescription medicine to help prevent HIV infection. If you choose to take medicine to prevent HIV, you should first get tested for HIV. You should then be tested every 3 months for as long as you are taking the medicine. Pregnancy  If you are about to stop having your period (premenopausal) and  you may become pregnant, seek counseling before you get pregnant.  Take 400 to 800 micrograms (mcg) of folic acid every day if you become pregnant.  Ask for birth control (contraception) if you want to prevent pregnancy. Osteoporosis and menopause Osteoporosis is a disease in which the bones lose minerals and strength with aging. This can result in bone fractures. If you are 65 years old or older, or if you are at risk for osteoporosis and fractures, ask your health care provider if you should:  Be screened for bone loss.  Take a calcium or vitamin D supplement to lower your risk of fractures.  Be given hormone replacement therapy (HRT) to treat symptoms of menopause. Follow these instructions at home: Lifestyle  Do not use any products that contain nicotine or tobacco, such as cigarettes, e-cigarettes, and chewing tobacco. If you need help quitting, ask your health care provider.  Do not use street drugs.  Do not share needles.  Ask your health care provider for help if you need support or information about quitting drugs. Alcohol use  Do not drink alcohol if: ? Your health care provider tells you not to drink. ? You are pregnant, may be pregnant, or are planning to become pregnant.  If you drink alcohol: ? Limit how much you use to 0-1 drink a day. ? Limit intake if you are breastfeeding.  Be aware of how much alcohol is in your drink. In the [place] one drink equals one 12 oz bottle of beer (355 mL), one 5 oz glass of wine (148 mL), or one 1 oz glass of hard liquor (44 mL). General instructions  Schedule regular health, dental, and eye exams.  Stay current with your vaccines.  Tell your health care provider if: ? You often feel depressed. ? You have ever been abused or do not feel safe at home. Summary  Adopting a healthy lifestyle and getting preventive care are important in promoting health and wellness.  Follow your health care provider's instructions about healthy  diet, exercising, and getting tested or screened for diseases.  Follow your health care provider's instructions on monitoring your cholesterol and blood pressure. This information is not intended to replace advice given to you by your health care provider. Make sure you discuss any questions you have with your health care provider. Document Released: 03/11/2011 Document Revised: 08/19/2018 Document Reviewed: 08/19/2018 Elsevier Patient Education  2020 Elsevier Inc.  

## 2019-07-29 NOTE — Assessment & Plan Note (Signed)
Last HgA1c at goal and was 5.8. Denies hypoglycemia. Checking microalbumin to creatinine ratio and foot exam done. Eye exam up to date. On ARB but not statin.

## 2019-07-29 NOTE — Assessment & Plan Note (Signed)
Flu shot up to date. Pneumonia given 23. Shingrix given 1st today. Tetanus up to date. Cologuard up to date. Mammogram up to date, pap smear up to date. Counseled about sun safety and mole surveillance. Counseled about the dangers of distracted driving. Given 10 year screening recommendations.

## 2019-07-29 NOTE — Assessment & Plan Note (Signed)
Checking CMP and adjust irbesartan as needed. BP at goal.

## 2019-07-29 NOTE — Assessment & Plan Note (Signed)
Checking lipid panel. Not on statin.

## 2019-08-02 ENCOUNTER — Ambulatory Visit: Payer: Self-pay | Admitting: *Deleted

## 2019-08-02 NOTE — Telephone Encounter (Signed)
Tried calling back no answer left voicemail.

## 2019-08-02 NOTE — Telephone Encounter (Signed)
   Reason for Disposition . Caller requesting lab results  Answer Assessment - Initial Assessment Questions 1. REASON FOR CALL or QUESTION: "What is your reason for calling today?" or "How can I best help you?" or "What question do you have that I can help answer?"     Calling for [phone] rsults  Answer Assessment - Initial Assessment Questions 1. REASON FOR CALL or QUESTION: "What is your reason for calling today?" or "How can I best help you?" or "What question do you have that I can help answer?"     Covid19 results 2. CALLER: Document the source of call. (e.g., laboratory, patient).     Patient.  Protocols used: PCP CALL - NO TRIAGE-A-AH, INFORMATION ONLY CALL - NO TRIAGE-A-AH

## 2019-08-02 NOTE — Telephone Encounter (Addendum)
Pt called to obtain labs from last week; also see CRM # [phone]; .pt given results per Dr Sharlet Salina, "Sugars are controlled at 6.0. Kidneys and liver normal. Cholesterol is too high and we should start a cholesterol medicine."; the pt uses [facility] [address], [place]; she would like to discuss this; the pt can be contacted at [phone]; the pt sees Dr Sharlet Salina, [place].

## 2019-08-04 ENCOUNTER — Other Ambulatory Visit: Payer: Self-pay | Admitting: Internal Medicine

## 2019-08-04 MED ORDER — SIMVASTATIN 20 MG PO TABS: 20.0000 mg | ORAL_TABLET | Freq: Every day | ORAL | 3 refills | Status: DC

## 2019-08-11 ENCOUNTER — Ambulatory Visit: Payer: Managed Care, Other (non HMO) | Admitting: Endocrinology

## 2019-08-17 ENCOUNTER — Encounter: Payer: Self-pay | Admitting: Internal Medicine

## 2019-08-17 NOTE — Progress Notes (Signed)
Abstracted and sent to scan  

## 2019-08-24 ENCOUNTER — Other Ambulatory Visit: Payer: Self-pay

## 2019-08-26 ENCOUNTER — Other Ambulatory Visit: Payer: Self-pay

## 2019-08-26 ENCOUNTER — Encounter: Payer: Self-pay | Admitting: Endocrinology

## 2019-08-26 ENCOUNTER — Ambulatory Visit (INDEPENDENT_AMBULATORY_CARE_PROVIDER_SITE_OTHER): Payer: Managed Care, Other (non HMO) | Admitting: Endocrinology

## 2019-08-26 VITALS — BP 142/70 | HR 95 | Ht 68.0 in | Wt 192.6 lb

## 2019-08-26 DIAGNOSIS — E1169 Type 2 diabetes mellitus with other specified complication: Secondary | ICD-10-CM | POA: Diagnosis not present

## 2019-08-26 NOTE — Patient Instructions (Addendum)
Your blood pressure is high today.  Please see your primary care provider soon, to have it rechecked Please continue the same diabetes medications.   check your blood sugar twice a week.  vary the time of day when you check, between before the 3 meals, and at bedtime.  also check if you have symptoms of your blood sugar being too high or too low.  please keep a record of the readings and bring it to your next appointment here (or you can bring the meter itself).  You can write it on any piece of paper.  please call us sooner if your blood sugar goes below 70, or if you have a lot of readings over 200. Please come back for a follow-up appointment in 6 months.

## 2019-08-26 NOTE — Progress Notes (Signed)
Subjective:    Patient ID: Elizabeth Alvarado, female    DOB: [birth year], 53 y.o.   MRN: [id number]  HPI Pt returns for f/u of diabetes mellitus: DM type: 2 Dx'ed: 2018 Complications: none Therapy: jentaduento GDM: 2001 DKA: never Severe hypoglycemia: never Pancreatitis: never Pancreatic imaging: normal on 2012 CT Other: She declines weight loss surgery; she has never been on insulin.   Interval history: pt states she feels well in general.  She takes medication as rx'ed.   Past Medical History:  Diagnosis Date  . Diabetes mellitus without complication (HCC)   . DILATION AND CURETTAGE, HX OF 08/25/2007  . GERD (gastroesophageal reflux disease)   . Hyperlipidemia   . RENAL CALCULUS, HX OF 08/25/2007  . Ureteral stone 01/09/2011  . UTI'S, HX OF 08/25/2007    Past Surgical History:  Procedure Laterality Date  . BACK SURGERY    . BREAST BIOPSY Left   . CERVIX LESION DESTRUCTION  April '12   minor abnormal PAP; "hot ballon application"  . DILATION AND CURETTAGE OF UTERUS     hx of  . LITHOTRIPSY      Social History   Socioeconomic History  . Marital status: Married    Spouse name: Not on file  . Number of children: 1  . Years of education: 32  . Highest education level: Not on file  Occupational History  . 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    Employer: [employer]    Comment: [employer] [place] [employer] acctg.  Tobacco Use  . Smoking status: Never Smoker  . Smokeless tobacco: Never Used  Substance and Sexual Activity  . Alcohol use: Yes    Alcohol/week: 2.0 standard drinks    Types: 2 Standard drinks or equivalent per week    Comment: rare drink of wine or beer once a month  . Drug use: No  . Sexual activity: Yes    Partners: Male    Comment: Monogamous relationship with husband.  Other Topics Concern  . 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 1 son '01.Marriage is good.     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 Life is good   Social Determinants of Corporate investment banker Strain:   . Difficulty of Paying Living Expenses: Not on file  Food Insecurity:   . Worried About Programme researcher, broadcasting/film/video in the Last Year: Not on file  . Ran Out of Food in the Last Year: Not on file  Transportation Needs:   . Lack of Transportation (Medical): Not on file  . Lack of Transportation (Non-Medical): Not on file  Physical Activity:   . Days of Exercise per Week: Not on file  . Minutes of Exercise per Session: Not on file  Stress:   . Feeling of Stress : Not on file  Social Connections:   . Frequency of Communication with Friends and Family: Not on file  . Frequency of Social Gatherings with Friends and Family: Not on file  . Attends Religious Services: Not on file  . Active Member of Clubs or Organizations: Not on file  . Attends Banker Meetings: Not on file  . Marital Status: Not on file  Intimate Partner Violence:   . Fear of Current or Ex-Partner: Not on file  . Emotionally Abused: Not on file  . Physically Abused: Not on file  . Sexually Abused: Not on file    Current  Outpatient Medications on File Prior to Visit  Medication Sig Dispense Refill  . irbesartan (AVAPRO) 150 MG tablet Take 1 tablet (150 mg total) by mouth daily. 90 tablet 3  . JENTADUETO XR 2.01-999 MG TB24 TAKE 1 TABLET BY MOUTH EVERY MORNING 90 tablet 3  . pantoprazole (PROTONIX) 40 MG tablet TAKE 1 TABLET(40 MG) BY MOUTH DAILY 30 tablet 11  . simvastatin (ZOCOR) 20 MG tablet Take 1 tablet (20 mg total) by mouth at bedtime. 90 tablet 3   No current facility-administered medications on file prior to visit.    No Known Allergies  Family History  Problem Relation Age of Onset  . Cancer Mother        breast cancer, 1943  . Hypertension Mother   . Cancer Father        prostate cancer, 33  . Hyperlipidemia Father   . Diabetes Neg Hx   . Colon cancer Neg Hx   .  Esophageal cancer Neg Hx   . Stomach cancer Neg Hx     BP (!) 142/70 (BP Location: Right Arm, Patient Position: Sitting, Cuff Size: Large)   Pulse 95   Ht 5\' 8"  (1.727 m)   Wt 192 lb 9.6 oz (87.4 kg)   SpO2 96%   BMI 29.28 kg/m    Review of Systems Denies nausea.     Objective:   Physical Exam VITAL SIGNS:  See vs page GENERAL: no distress Pulses: dorsalis pedis intact bilat.   MSK: no deformity of the feet CV: no leg edema Skin:  no ulcer on the feet.  normal color and temp on the feet. Neuro: sensation is intact to touch on the feet  Lab Results  Component Value Date   HGBA1C 6.0 07/29/2019       Assessment & Plan:  HTN: is noted today Type 2 DM: well-controlled.    Patient Instructions  Your blood pressure is high today.  Please see your primary care provider soon, to have it rechecked Please continue the same diabetes medications.   check your blood sugar twice a week.  vary the time of day when you check, between before the 3 meals, and at bedtime.  also check if you have symptoms of your blood sugar being too high or too low.  please keep a record of the readings and bring it to your next appointment here (or you can bring the meter itself).  You can write it on any piece of paper.  please call us sooner if your blood sugar goes below 70, or if you have a lot of readings over 200. Please come back for a follow-up appointment in 6 months.

## 2019-09-27 ENCOUNTER — Telehealth: Payer: Self-pay

## 2019-09-27 NOTE — Telephone Encounter (Signed)
PA is required for Jentadueto. Would you like to do PA or change medication?

## 2019-09-27 NOTE — Telephone Encounter (Signed)
Called pt and left detailed voicemail explaining that her insurance no longer covers this medication and that at this point, she should contact insurance and verify what they will cover. Once this has been determined, the pt is to call this office back and let us know.

## 2019-09-27 NOTE — Telephone Encounter (Signed)
Pt returned phone call and stated that insurance has told her the formulary alternatives are  Metformin  Janumet XR  and Januvia

## 2019-09-27 NOTE — Telephone Encounter (Signed)
I am happy to change to alternative--just let me know which one is preferred

## 2019-09-28 MED ORDER — JANUMET XR 50-1000 MG PO TB24: 1.0000 | ORAL_TABLET | ORAL | 3 refills | Status: DC

## 2019-09-28 NOTE — Telephone Encounter (Signed)
Outpatient Medication Detail   Disp Refills Start End   SitaGLIPtin-MetFORMIN HCl (JANUMET XR) 50-1000 MG TB24 90 tablet 3 09/28/2019    Sig - Route: Take 1 tablet by mouth every morning. - Oral   Sent to pharmacy as: SitaGLIPtin-MetFORMIN HCl (JANUMET XR) 50-1000 MG Tablet SR 24 hr   E-Prescribing Status: Receipt confirmed by pharmacy (09/28/2019 12:18 PM EST)

## 2019-09-28 NOTE — Telephone Encounter (Signed)
I have sent a prescription to your pharmacy, to change to [facility]

## 2019-09-28 NOTE — Addendum Note (Signed)
Addended by: Romero Belling on: 09/28/2019 12:18 PM   Modules accepted: Orders

## 2019-10-07 ENCOUNTER — Ambulatory Visit: Payer: Managed Care, Other (non HMO)

## 2019-10-14 ENCOUNTER — Other Ambulatory Visit: Payer: Self-pay

## 2019-10-14 ENCOUNTER — Ambulatory Visit (INDEPENDENT_AMBULATORY_CARE_PROVIDER_SITE_OTHER): Payer: Managed Care, Other (non HMO) | Admitting: *Deleted

## 2019-10-14 DIAGNOSIS — Z23 Encounter for immunization: Secondary | ICD-10-CM | POA: Diagnosis not present

## 2019-10-18 ENCOUNTER — Other Ambulatory Visit: Payer: Self-pay

## 2019-10-18 MED ORDER — PANTOPRAZOLE SODIUM 40 MG PO TBEC: DELAYED_RELEASE_TABLET | ORAL | 1 refills | Status: DC

## 2019-10-20 ENCOUNTER — Telehealth: Payer: Self-pay

## 2019-10-20 MED ORDER — SIMVASTATIN 20 MG PO TABS: 20.0000 mg | ORAL_TABLET | Freq: Every day | ORAL | 3 refills | Status: DC

## 2019-10-20 MED ORDER — IRBESARTAN 150 MG PO TABS: 150.0000 mg | ORAL_TABLET | Freq: Every day | ORAL | 3 refills | Status: DC

## 2019-10-20 NOTE — Telephone Encounter (Signed)
New message    1. Which medications need to be refilled? (please list name of each medication and dose if known)   irbesartan (AVAPRO) 150 MG tablet  simvastatin (ZOCOR) 20 MG tablet   2. Which pharmacy/location (including street and city if local pharmacy) is medication to be sent to?[facility]   3. Do they need a 30 day or 90 day supply? 90 day supply

## 2019-10-29 ENCOUNTER — Telehealth: Payer: Self-pay | Admitting: Endocrinology

## 2019-10-29 ENCOUNTER — Other Ambulatory Visit: Payer: Self-pay

## 2019-10-29 DIAGNOSIS — E1169 Type 2 diabetes mellitus with other specified complication: Secondary | ICD-10-CM

## 2019-10-29 MED ORDER — JANUMET XR 50-1000 MG PO TB24: 1.0000 | ORAL_TABLET | ORAL | 0 refills | Status: DC

## 2019-10-29 MED ORDER — JENTADUETO XR 2.5-1000 MG PO TB24: 1.0000 | ORAL_TABLET | ORAL | 3 refills | Status: DC

## 2019-10-29 NOTE — Telephone Encounter (Signed)
MEDICATION: jentadueto xr  PHARMACY:   [facility] [phone] - [place],  - [address] [place] [address] Phone:  [phone]  Fax:  [phone]     IS THIS A 90 DAY SUPPLY : no  IS PATIENT OUT OF MEDICATION: no  IF NOT; HOW MUCH IS LEFT: 3 days   LAST APPOINTMENT DATE: 08/26/19  NEXT APPOINTMENT DATE: 03/01/20  DO WE HAVE YOUR PERMISSION TO LEAVE A DETAILED MESSAGE: yes  OTHER COMMENTS: Patient stated that she used to be prescribed this RX and they switched to [employer] and she is wanting to go back to the [employer]   **Let patient know to contact pharmacy at the end of the day to make sure medication is ready. **  ** Please notify patient to allow 48-72 hours to process**  **Encourage patient to contact the pharmacy for refills or they can request refills through [facility]**

## 2019-10-29 NOTE — Telephone Encounter (Signed)
Outpatient Medication Detail   Disp Refills Start End   linaGLIPtin-metFORMIN HCl ER (JENTADUETO XR) 2.01-999 MG TB24 90 tablet 3 10/29/2019    Sig - Route: Take 1 tablet by mouth every morning. - Oral   Sent to pharmacy as: linaGLIPtin-metFORMIN HCl ER (JENTADUETO XR) 2.01-999 MG Tablet SR 24 hr   E-Prescribing Status: Receipt confirmed by pharmacy (10/29/2019  1:49 PM EST)    Pt requests to address medical necessity for PA at June office visit

## 2019-10-29 NOTE — Telephone Encounter (Signed)
Spoke with pt as she is not clear as to why Janumet was called in again against her wish of getting jentadueto. Pt provided information to me that I do not see in the chart, she has spoken with her insurance and they are sending a form over to [place] because if Dr. Everardo All will approve the [facility] they will cover it. Pt is still using the coupon as well that Dr. Everardo All provided to her for the [facility] as it covers the medication in full without filing insurance until 09/08/2020.   Her pharmacy has run out of the original jentadueto rx and that is why she is asking for this to be called back in for her as she is just going to use the card and she is asking for [place] to fill out the form for the jentadueto to be approved.  I let the pt know that if this form is a PA then that is originally what Dr. Everardo All decided to not do because using preferred drugs is preferred. She will discuss with Dr. Everardo All at their upcoming appt

## 2019-10-29 NOTE — Addendum Note (Signed)
Addended by: Romero Belling on: 10/29/2019 01:50 PM   Modules accepted: Orders

## 2019-10-29 NOTE — Telephone Encounter (Signed)
According to documentation below, Rx was CHANGED to Janumet. Therefore, Rx for Janumet has been sent d/t no longer being on Jentadueto. In addition, it appears from documentation below, pt was aware of this change.  Outpatient Medication Detail   Disp Refills Start End   SitaGLIPtin-MetFORMIN HCl (JANUMET XR) 50-1000 MG TB24 90 tablet 0 10/29/2019    Sig - Route: Take 1 tablet by mouth every morning. - Oral   Sent to pharmacy as: SitaGLIPtin-MetFORMIN HCl (JANUMET XR) 50-1000 MG Tablet SR 24 hr   E-Prescribing Status: Receipt confirmed by pharmacy (10/29/2019 10:45 AM EST)    From 09/27/19 encounter: Me      1:00 PM Note   Outpatient Medication Detail     Disp Refills Start End    SitaGLIPtin-MetFORMIN HCl (JANUMET XR) 50-1000 MG TB24 90 tablet 3 09/28/2019      Sig - Route: Take 1 tablet by mouth every morning. - Oral    Sent to pharmacy as: SitaGLIPtin-MetFORMIN HCl (JANUMET XR) 50-1000 MG Tablet SR 24 hr    E-Prescribing Status: Receipt confirmed by pharmacy (09/28/2019 12:18 PM EST)         Romero Belling, MD      12:18 PM Note   Addended by: Romero Belling on: 09/28/2019 12:18 PM     Modules accepted: Orders           12:18 PM Romero Belling, MD routed this conversation to Me  Romero Belling, MD      12:18 PM Note   I have sent a prescription to your pharmacy, to change to [facility]    September 27, 2019       4:04 PM Mincy, Bary Castilla, LPN routed this conversation to Romero Belling, MD  Darrick Grinder, LPN      0:09 PM Note   Pt returned phone call and stated that insurance has told her the formulary alternatives are  Metformin  Janumet XR  and Januvia    Mincy, Noah F, LPN      3:81 PM Note   Called pt and left detailed voicemail explaining that her insurance no longer covers this medication and that at this point, she should contact insurance and verify what they will cover. Once this has been determined, the pt is to call this office back and let us know.           2:17 PM Romero Belling, MD routed this conversation to Me  Romero Belling, MD      2:17 PM Note   I am happy to change to alternative--just let me know which one is preferred          10:19 AM Mincy, Bary Castilla, LPN routed this conversation to Romero Belling, MD  Darrick Grinder, LPN      82:99 AM Note   PA is required for [facility]. Would you like to do PA or change medication?

## 2019-10-29 NOTE — Telephone Encounter (Signed)
Please review and advise.

## 2019-10-29 NOTE — Telephone Encounter (Signed)
OK, I have sent a prescription to your pharmacy, to change back to jentadueto.  However, ins appears to prefer Janumet.  If we do PA, ins will want to know why you can't take preferred rx.

## 2019-12-23 ENCOUNTER — Other Ambulatory Visit: Payer: Self-pay | Admitting: Obstetrics and Gynecology

## 2019-12-23 DIAGNOSIS — Z1231 Encounter for screening mammogram for malignant neoplasm of breast: Secondary | ICD-10-CM

## 2020-01-27 ENCOUNTER — Ambulatory Visit
Admission: RE | Admit: 2020-01-27 | Discharge: 2020-01-27 | Disposition: A | Discharge status: Home / Self Care | Payer: Managed Care, Other (non HMO) | Source: Ambulatory Visit | Attending: Obstetrics and Gynecology | Admitting: Obstetrics and Gynecology

## 2020-01-27 ENCOUNTER — Other Ambulatory Visit: Payer: Self-pay

## 2020-01-27 DIAGNOSIS — Z1231 Encounter for screening mammogram for malignant neoplasm of breast: Secondary | ICD-10-CM

## 2020-03-01 ENCOUNTER — Ambulatory Visit: Payer: Managed Care, Other (non HMO) | Admitting: Endocrinology

## 2020-03-14 ENCOUNTER — Ambulatory Visit: Payer: Managed Care, Other (non HMO) | Admitting: Endocrinology

## 2020-04-11 ENCOUNTER — Ambulatory Visit: Payer: Managed Care, Other (non HMO) | Admitting: Endocrinology

## 2020-04-20 ENCOUNTER — Other Ambulatory Visit: Payer: Self-pay | Admitting: Gastroenterology

## 2020-04-20 LAB — HM DIABETES EYE EXAM

## 2020-04-25 ENCOUNTER — Other Ambulatory Visit: Payer: Self-pay

## 2020-04-25 ENCOUNTER — Encounter: Payer: Self-pay | Admitting: Endocrinology

## 2020-04-25 ENCOUNTER — Ambulatory Visit (INDEPENDENT_AMBULATORY_CARE_PROVIDER_SITE_OTHER): Payer: Managed Care, Other (non HMO) | Admitting: Endocrinology

## 2020-04-25 VITALS — BP 132/76 | HR 85 | Ht 68.0 in | Wt 195.6 lb

## 2020-04-25 DIAGNOSIS — E1169 Type 2 diabetes mellitus with other specified complication: Secondary | ICD-10-CM | POA: Diagnosis not present

## 2020-04-25 LAB — POCT GLYCOSYLATED HEMOGLOBIN (HGB A1C): Hemoglobin A1C: 6 % — AB (ref 4.0–5.6)

## 2020-04-25 NOTE — Patient Instructions (Addendum)
Please continue the same diabetes medication.   check your blood sugar twice a week.  vary the time of day when you check, between before the 3 meals, and at bedtime.  also check if you have symptoms of your blood sugar being too high or too low.  please keep a record of the readings and bring it to your next appointment here (or you can bring the meter itself).  You can write it on any piece of paper.  please call us sooner if your blood sugar goes below 70, or if you have a lot of readings over 200. Please come back for a follow-up appointment in 6 months.

## 2020-04-25 NOTE — Progress Notes (Signed)
Subjective:    Patient ID: Elizabeth Alvarado, female    DOB: August 13, 1966, 54 y.o.   MRN: [id number]  HPI Pt returns for f/u of diabetes mellitus: DM type: 2 Dx'ed: 2018 Complications: none.   Therapy: Jentaduento.  GDM: 2001 DKA: never Severe hypoglycemia: never Pancreatitis: never Pancreatic imaging: normal on 2012 CT Other: She declines weight loss surgery; she has never been on insulin.   Interval history: pt states she feels well in general.  She takes medication as rx'ed.  She says cbgs are well-controlled.    Past Medical History:  Diagnosis Date   Diabetes mellitus without complication (HCC)    DILATION AND CURETTAGE, HX OF 08/25/2007   GERD (gastroesophageal reflux disease)    Hyperlipidemia    RENAL CALCULUS, HX OF 08/25/2007   Ureteral stone 01/09/2011   UTI'S, HX OF 08/25/2007    Past Surgical History:  Procedure Laterality Date   BACK SURGERY     BREAST BIOPSY Left    CERVIX LESION DESTRUCTION  April '12   minor abnormal PAP; "hot ballon application"   DILATION AND CURETTAGE OF UTERUS     hx of   LITHOTRIPSY      Social History   Socioeconomic History   Marital status: Married    Spouse name: Not on file   Number of children: 1   Years of education: 14   Highest education level: Not on file  Occupational History   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    Employer: [employer]    Comment: [employer] [place] [employer] ac[employer]g.  Tobacco Use   Smoking status: Never Smoker   Smokeless tobacco: Never Used  Vaping Use   Vaping Use: Never used  Substance and Sexual Activity   Alcohol use: Yes    Alcohol/week: 2.0 standard drinks    Types: 2 Standard drinks or equivalent per week    Comment: rare drink of wine or beer once a month   Drug use: No   Sexual activity: Yes    Partners: Male    Comment: Monogamous relationship with husband.  Other Topics Concern   Not on file  Social History Narrative   [employer], [employer] - 2 year accounting degree. Married '95. 1 son '01.Marriage is good.    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 Life is good   Chemical engineer Strain:    Difficulty of Paying Living Expenses: Not on file  Food Insecurity:    Worried About Programme researcher, broadcasting/film/video in the Last Year: Not on file   [employer] of Food in the Last Year: Not on file  Transportation Needs:    Lack of Transportation (Medical): Not on file   Lack of Transportation (Non-Medical): Not on file  Physical Activity:    Days of Exercise per Week: Not on file   Minutes of Exercise per Session: Not on file  Stress:    Feeling of Stress : Not on file  Social Connections:    Frequency of Communication with Friends and Family: Not on file   Frequency of Social Gatherings with Friends and Family: Not on file   Attends Religious Services: Not on file   Active Member of Clubs or Organizations: Not on file   Attends Banker Meetings: Not on file   Marital Status: Not on file  Intimate Partner Violence:    Fear of Current or Ex-Partner: Not on file   Emotionally Abused: Not on  file   Physically Abused: Not on file   Sexually Abused: Not on file    Current Outpatient Medications on File Prior to Visit  Medication Sig Dispense Refill   irbesartan (AVAPRO) 150 MG tablet Take 1 tablet (150 mg total) by mouth daily. 90 tablet 3   linaGLIPtin-metFORMIN HCl ER (JENTADUETO XR) 2.01-999 MG TB24 Take 1 tablet by mouth every morning. 90 tablet 3   pantoprazole (PROTONIX) 40 MG tablet TAKE 1 TABLET DAILY 90 tablet 0   simvastatin (ZOCOR) 20 MG tablet Take 1 tablet (20 mg total) by mouth at bedtime. 90 tablet 3   No current facility-administered medications on file prior to visit.    No Known Allergies  Family History  Problem Relation Age of Onset   Cancer Mother        breast cancer, 1943   Hypertension Mother    Cancer Father        prostate  cancer, 1942   Hyperlipidemia Father    Diabetes Neg Hx    Colon cancer Neg Hx    Esophageal cancer Neg Hx    Stomach cancer Neg Hx     BP 132/76    Pulse 85    Ht 5\' 8"  (1.727 m)    Wt 195 lb 9.6 oz (88.7 kg)    SpO2 97%    BMI 29.74 kg/m    Review of Systems Denies nausea    Objective:   Physical Exam VITAL SIGNS:  See vs page GENERAL: no distress Pulses: dorsalis pedis intact bilat.   MSK: no deformity of the feet CV: no leg edema Skin:  no ulcer on the feet.  normal color and temp on the feet. Neuro: sensation is intact to touch on the feet Ext: there is bilateral onychomycosis of the toenails  A1c=6.0%     Assessment & Plan:  Type 2 DM: well-controlled   Patient Instructions  Please continue the same diabetes medication.   check your blood sugar twice a week.  vary the time of day when you check, between before the 3 meals, and at bedtime.  also check if you have symptoms of your blood sugar being too high or too low.  please keep a record of the readings and bring it to your next appointment here (or you can bring the meter itself).  You can write it on any piece of paper.  please call sooner if your blood sugar goes below 70, or if you have a lot of readings over 200. Please come back for a follow-up appointment in 6 months.

## 2020-07-24 ENCOUNTER — Other Ambulatory Visit: Payer: Self-pay | Admitting: Gastroenterology

## 2020-07-31 ENCOUNTER — Encounter: Payer: Managed Care, Other (non HMO) | Admitting: Internal Medicine

## 2020-08-08 ENCOUNTER — Telehealth: Payer: Self-pay

## 2020-08-08 MED ORDER — JANUMET XR 50-1000 MG PO TB24: 1.0000 | ORAL_TABLET | Freq: Every day | ORAL | 3 refills | Status: DC

## 2020-08-08 NOTE — Telephone Encounter (Signed)
What alternative is covered? 

## 2020-08-08 NOTE — Telephone Encounter (Signed)
Covered Alternatives that do not require PA.   Metformin 500 Mg Tb24, Janumet 50-1,000 Mg Tab Janumet XR 50-1,000 Mg TM24,

## 2020-08-08 NOTE — Telephone Encounter (Signed)
Received notification that the Jentadueto XR was not covered under patients insurance- would you like [place] to attempt PA, or change to something else.   Thank you!

## 2020-08-08 NOTE — Telephone Encounter (Signed)
Ok, I have sent a prescription to your pharmacy, to change 

## 2020-08-08 NOTE — Telephone Encounter (Signed)
RX sent to pharmacy- since other medication was not covered.

## 2020-08-15 ENCOUNTER — Telehealth: Payer: Self-pay | Admitting: Internal Medicine

## 2020-08-15 DIAGNOSIS — E1169 Type 2 diabetes mellitus with other specified complication: Secondary | ICD-10-CM

## 2020-08-15 NOTE — Telephone Encounter (Signed)
Patient called and was wondering if she can have labs ordered before her appointment on 12.29.2021. she wanted to go over them with Dr. Okey Dupre.

## 2020-08-17 NOTE — Telephone Encounter (Signed)
Placed lab orders.

## 2020-08-17 NOTE — Telephone Encounter (Signed)
Pt informed of below. Lab appt made.

## 2020-08-28 ENCOUNTER — Other Ambulatory Visit: Payer: Managed Care, Other (non HMO)

## 2020-08-31 ENCOUNTER — Other Ambulatory Visit (INDEPENDENT_AMBULATORY_CARE_PROVIDER_SITE_OTHER): Payer: Managed Care, Other (non HMO)

## 2020-08-31 DIAGNOSIS — E1169 Type 2 diabetes mellitus with other specified complication: Secondary | ICD-10-CM

## 2020-08-31 LAB — COMPREHENSIVE METABOLIC PANEL
ALT: 30 U/L (ref 0–35)
AST: 22 U/L (ref 0–37)
Albumin: 4.3 g/dL (ref 3.5–5.2)
Alkaline Phosphatase: 99 U/L (ref 39–117)
BUN: 20 mg/dL (ref 6–23)
CO2: 29 mEq/L (ref 19–32)
Calcium: 9.8 mg/dL (ref 8.4–10.5)
Chloride: 104 mEq/L (ref 96–112)
Creatinine, Ser: 0.71 mg/dL (ref 0.40–1.20)
GFR: 97.14 mL/min (ref >60.00)
Glucose, Bld: 129 mg/dL — ABNORMAL HIGH (ref 70–99)
Potassium: 4.4 mEq/L (ref 3.5–5.1)
Sodium: 140 mEq/L (ref 135–145)
Total Bilirubin: 0.4 mg/dL (ref 0.2–1.2)
Total Protein: 7.8 g/dL (ref 6.0–8.3)

## 2020-08-31 LAB — LIPID PANEL
Cholesterol: 141 mg/dL (ref 0–200)
HDL: 44.3 mg/dL (ref >39.00)
LDL Cholesterol: 74 mg/dL (ref 0–99)
NonHDL: 97.15
Total CHOL/HDL Ratio: 3
Triglycerides: 114 mg/dL (ref 0.0–149.0)
VLDL: 22.8 mg/dL (ref 0.0–40.0)

## 2020-08-31 LAB — MICROALBUMIN / CREATININE URINE RATIO
Creatinine,U: 90.2 mg/dL
Microalb Creat Ratio: 3 mg/g (ref 0.0–30.0)
Microalb, Ur: 2.7 mg/dL — ABNORMAL HIGH (ref 0.0–1.9)

## 2020-08-31 LAB — HEMOGLOBIN A1C: Hgb A1c MFr Bld: 6.5 % (ref 4.6–6.5)

## 2020-08-31 LAB — CBC
HCT: 38.3 % (ref 36.0–46.0)
Hemoglobin: 13.2 g/dL (ref 12.0–15.0)
MCHC: 34.5 g/dL (ref 30.0–36.0)
MCV: 87 fl (ref 78.0–100.0)
Platelets: 304 10*3/uL (ref 150.0–400.0)
RBC: 4.4 Mil/uL (ref 3.87–5.11)
RDW: 14 % (ref 11.5–15.5)
WBC: 8.1 10*3/uL (ref 4.0–10.5)

## 2020-09-06 ENCOUNTER — Ambulatory Visit (INDEPENDENT_AMBULATORY_CARE_PROVIDER_SITE_OTHER): Payer: Managed Care, Other (non HMO) | Admitting: Internal Medicine

## 2020-09-06 ENCOUNTER — Other Ambulatory Visit: Payer: Self-pay

## 2020-09-06 ENCOUNTER — Encounter: Payer: Self-pay | Admitting: Internal Medicine

## 2020-09-06 VITALS — BP 130/84 | HR 73 | Temp 98.0°F | Ht 68.0 in | Wt 197.8 lb

## 2020-09-06 DIAGNOSIS — E781 Pure hyperglyceridemia: Secondary | ICD-10-CM

## 2020-09-06 DIAGNOSIS — I10 Essential (primary) hypertension: Secondary | ICD-10-CM

## 2020-09-06 DIAGNOSIS — E1169 Type 2 diabetes mellitus with other specified complication: Secondary | ICD-10-CM

## 2020-09-06 DIAGNOSIS — Z0000 Encounter for general adult medical examination without abnormal findings: Secondary | ICD-10-CM | POA: Diagnosis not present

## 2020-09-06 MED ORDER — SIMVASTATIN 20 MG PO TABS: 20.0000 mg | ORAL_TABLET | Freq: Every day | ORAL | 3 refills | Status: DC

## 2020-09-06 MED ORDER — IRBESARTAN 150 MG PO TABS
150.0000 mg | ORAL_TABLET | Freq: Every day | ORAL | 3 refills | Status: DC
Start: 2020-09-06 — End: 2021-10-17

## 2020-09-06 MED ORDER — PANTOPRAZOLE SODIUM 40 MG PO TBEC: DELAYED_RELEASE_TABLET | ORAL | 3 refills | Status: DC

## 2020-09-06 NOTE — Patient Instructions (Addendum)
Think strongly about getting the covid-19 vaccine as this can save your life.    Health Maintenance, Female Adopting a healthy lifestyle and getting preventive care are important in promoting health and wellness. Ask your health care provider about:  The right schedule for you to have regular tests and exams.  Things you can do on your own to prevent diseases and keep yourself healthy. What should I know about diet, weight, and exercise? Eat a healthy diet   Eat a diet that includes plenty of vegetables, fruits, low-fat dairy products, and lean protein.  Do not eat a lot of foods that are high in solid fats, added sugars, or sodium. Maintain a healthy weight Body mass index (BMI) is used to identify weight problems. It estimates body fat based on height and weight. Your health care provider can help determine your BMI and help you achieve or maintain a healthy weight. Get regular exercise Get regular exercise. This is one of the most important things you can do for your health. Most adults should:  Exercise for at least 150 minutes each week. The exercise should increase your heart rate and make you sweat (moderate-intensity exercise).  Do strengthening exercises at least twice a week. This is in addition to the moderate-intensity exercise.  Spend less time sitting. Even light physical activity can be beneficial. Watch cholesterol and blood lipids Have your blood tested for lipids and cholesterol at 53 years of age, then have this test every 5 years. Have your cholesterol levels checked more often if:  Your lipid or cholesterol levels are high.  You are older than 53 years of age.  You are at high risk for heart disease. What should I know about cancer screening? Depending on your health history and family history, you may need to have cancer screening at various ages. This may include screening for:  Breast cancer.  Cervical cancer.  Colorectal cancer.  Skin  cancer.  Lung cancer. What should I know about heart disease, diabetes, and high blood pressure? Blood pressure and heart disease  High blood pressure causes heart disease and increases the risk of stroke. This is more likely to develop in people who have high blood pressure readings, are of African descent, or are overweight.  Have your blood pressure checked: ? Every 3-5 years if you are 53-47 years of age. ? Every year if you are 53 years old or older. Diabetes Have regular diabetes screenings. This checks your fasting blood sugar level. Have the screening done:  Once every three years after age 32 if you are at a normal weight and have a low risk for diabetes.  More often and at a younger age if you are overweight or have a high risk for diabetes. What should I know about preventing infection? Hepatitis B If you have a higher risk for hepatitis B, you should be screened for this virus. Talk with your health care provider to find out if you are at risk for hepatitis B infection. Hepatitis C Testing is recommended for:  Everyone born from [birth year] through 1964.  Anyone with known risk factors for hepatitis C. Sexually transmitted infections (STIs)  Get screened for STIs, including gonorrhea and chlamydia, if: ? You are sexually active and are younger than 53 years of age. ? You are older than 53 years of age and your health care provider tells you that you are at risk for this type of infection. ? Your sexual activity has changed since you were last screened,  and you are at increased risk for chlamydia or gonorrhea. Ask your health care provider if you are at risk.  Ask your health care provider about whether you are at high risk for HIV. Your health care provider may recommend a prescription medicine to help prevent HIV infection. If you choose to take medicine to prevent HIV, you should first get tested for HIV. You should then be tested every 3 months for as long as you are taking  the medicine. Pregnancy  If you are about to stop having your period (premenopausal) and you may become pregnant, seek counseling before you get pregnant.  Take 400 to 800 micrograms (mcg) of folic acid every day if you become pregnant.  Ask for birth control (contraception) if you want to prevent pregnancy. Osteoporosis and menopause Osteoporosis is a disease in which the bones lose minerals and strength with aging. This can result in bone fractures. If you are 65 years old or older, or if you are at risk for osteoporosis and fractures, ask your health care provider if you should:  Be screened for bone loss.  Take a calcium or vitamin D supplement to lower your risk of fractures.  Be given hormone replacement therapy (HRT) to treat symptoms of menopause. Follow these instructions at home: Lifestyle  Do not use any products that contain nicotine or tobacco, such as cigarettes, e-cigarettes, and chewing tobacco. If you need help quitting, ask your health care provider.  Do not use street drugs.  Do not share needles.  Ask your health care provider for help if you need support or information about quitting drugs. Alcohol use  Do not drink alcohol if: ? Your health care provider tells you not to drink. ? You are pregnant, may be pregnant, or are planning to become pregnant.  If you drink alcohol: ? Limit how much you use to 0-1 drink a day. ? Limit intake if you are breastfeeding.  Be aware of how much alcohol is in your drink. In the [place] one drink equals one 12 oz bottle of beer (355 mL), one 5 oz glass of wine (148 mL), or one 1 oz glass of hard liquor (44 mL). General instructions  Schedule regular health, dental, and eye exams.  Stay current with your vaccines.  Tell your health care provider if: ? You often feel depressed. ? You have ever been abused or do not feel safe at home. Summary  Adopting a healthy lifestyle and getting preventive care are important in  promoting health and wellness.  Follow your health care provider's instructions about healthy diet, exercising, and getting tested or screened for diseases.  Follow your health care provider's instructions on monitoring your cholesterol and blood pressure. This information is not intended to replace advice given to you by your health care provider. Make sure you discuss any questions you have with your health care provider. Document Revised: 08/19/2018 Document Reviewed: 08/19/2018 Elsevier Patient Education  2020 Elsevier Inc.  

## 2020-09-06 NOTE — Progress Notes (Signed)
   Subjective:   Patient ID: Elizabeth Alvarado, female    DOB: [birth year], 54 y.o.   MRN: [id number]  HPI The patient is a 54 YO female coming in for physical.   PMH, [facility], social history reviewed and updated.  Review of Systems  Constitutional: Negative.   HENT: Negative.   Eyes: Negative.   Respiratory: Negative for cough, chest tightness and shortness of breath.   Cardiovascular: Negative for chest pain, palpitations and leg swelling.  Gastrointestinal: Negative for abdominal distention, abdominal pain, constipation, diarrhea, nausea and vomiting.  Musculoskeletal: Negative.   Skin: Negative.   Neurological: Negative.   Psychiatric/Behavioral: Negative.     Objective:  Physical Exam Constitutional:      Appearance: She is well-developed and well-nourished.  HENT:     Head: Normocephalic and atraumatic.  Eyes:     Extraocular Movements: EOM normal.  Cardiovascular:     Rate and Rhythm: Normal rate and regular rhythm.  Pulmonary:     Effort: Pulmonary effort is normal. No respiratory distress.     Breath sounds: Normal breath sounds. No wheezing or rales.  Abdominal:     General: Bowel sounds are normal. There is no distension.     Palpations: Abdomen is soft.     Tenderness: There is no abdominal tenderness. There is no rebound.  Musculoskeletal:        General: No edema.     Cervical back: Normal range of motion.  Skin:    General: Skin is warm and dry.  Neurological:     Mental Status: She is alert and oriented to person, place, and time.     Coordination: Coordination normal.  Psychiatric:        Mood and Affect: Mood and affect normal.     Vitals:   09/06/20 0919  BP: 130/84  Pulse: 73  Temp: 98 F (36.7 C)  TempSrc: Oral  SpO2: 99%  Weight: 197 lb 12.8 oz (89.7 kg)  Height: 5\' 8"  (1.727 m)   This visit occurred during the SARS-CoV-2 public health emergency.  Safety protocols were in place, including screening questions prior to the visit,  additional usage of staff PPE, and extensive cleaning of exam room while observing appropriate contact time as indicated for disinfecting solutions.   Assessment & Plan:

## 2020-09-07 NOTE — Assessment & Plan Note (Signed)
Taking janumet-xr. Rx simvastatin for cholesterol. Taking ARB. Seeing endo for management and at goal.

## 2020-09-07 NOTE — Assessment & Plan Note (Signed)
Flu shot up to date. Covid-19 declines for now but open to getting. Pneumonia up to date. Shingrix counseled. Tetanus up to date. Cologuard up to date. Mammogram up to date, pap smear up to date. Counseled about sun safety and mole surveillance. Counseled about the dangers of distracted driving. Given 10 year screening recommendations.

## 2020-09-07 NOTE — Assessment & Plan Note (Signed)
BP at goal with irbesartan 150 mg daily. Recent CMP without indication for change.

## 2020-09-07 NOTE — Assessment & Plan Note (Signed)
Adding simvastatin with review of medical conditions and recent lipid panel.

## 2020-10-05 ENCOUNTER — Other Ambulatory Visit: Payer: Self-pay | Admitting: Endocrinology

## 2020-10-26 ENCOUNTER — Ambulatory Visit: Payer: Managed Care, Other (non HMO) | Admitting: Endocrinology

## 2020-12-15 ENCOUNTER — Other Ambulatory Visit: Payer: Self-pay | Admitting: Obstetrics and Gynecology

## 2020-12-15 DIAGNOSIS — Z1231 Encounter for screening mammogram for malignant neoplasm of breast: Secondary | ICD-10-CM

## 2021-02-08 ENCOUNTER — Ambulatory Visit
Admission: RE | Admit: 2021-02-08 | Discharge: 2021-02-08 | Disposition: A | Discharge status: Home / Self Care | Payer: Managed Care, Other (non HMO) | Source: Ambulatory Visit

## 2021-02-08 ENCOUNTER — Other Ambulatory Visit: Payer: Self-pay

## 2021-02-08 DIAGNOSIS — Z1231 Encounter for screening mammogram for malignant neoplasm of breast: Secondary | ICD-10-CM

## 2021-03-01 ENCOUNTER — Other Ambulatory Visit: Payer: Self-pay

## 2021-03-01 ENCOUNTER — Ambulatory Visit (INDEPENDENT_AMBULATORY_CARE_PROVIDER_SITE_OTHER): Payer: Managed Care, Other (non HMO) | Admitting: Endocrinology

## 2021-03-01 VITALS — BP 180/100 | HR 90 | Ht 68.0 in | Wt 201.6 lb

## 2021-03-01 DIAGNOSIS — E1169 Type 2 diabetes mellitus with other specified complication: Secondary | ICD-10-CM | POA: Diagnosis not present

## 2021-03-01 LAB — POCT GLYCOSYLATED HEMOGLOBIN (HGB A1C): Hemoglobin A1C: 6.9 % — AB (ref 4.0–5.6)

## 2021-03-01 MED ORDER — JANUMET XR 50-1000 MG PO TB24: 1.0000 | ORAL_TABLET | Freq: Every morning | ORAL | 3 refills | Status: DC

## 2021-03-01 NOTE — Progress Notes (Signed)
Subjective:    Patient ID: Elizabeth Alvarado, female    DOB: [birth year], 55 y.o.   MRN: [id number]  HPI Pt returns for f/u of diabetes mellitus: DM type: 2 Dx'ed: 2018 Complications: none.   Therapy: Janumet.    GDM: 2001 DKA: never Severe hypoglycemia: never Pancreatitis: never Pancreatic imaging: normal on 2012 CT Other: She declines weight loss surgery; she has never been on insulin.   Interval history: pt states she feels well in general.  She takes medication as rx'ed.  She says cbgs are well-controlled.  She will have A1c rechecked 12/22, by Dr Okey Dupre.  Past Medical History:  Diagnosis Date   Diabetes mellitus without complication (HCC)    DILATION AND CURETTAGE, HX OF 08/25/2007   GERD (gastroesophageal reflux disease)    Hyperlipidemia    RENAL CALCULUS, HX OF 08/25/2007   Ureteral stone 01/09/2011   UTI'S, HX OF 08/25/2007    Past Surgical History:  Procedure Laterality Date   BACK SURGERY     BREAST BIOPSY Left    CERVIX LESION DESTRUCTION  April '12   minor abnormal PAP; "hot ballon application"   DILATION AND CURETTAGE OF UTERUS     hx of   LITHOTRIPSY      Social History   Socioeconomic History   Marital status: Married    Spouse name: Not on file   Number of children: 1   Years of education: 14   Highest education level: Not on file  Occupational History   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    Employer: [employer]    Comment: [employer], [place] [employer] acctg.  Tobacco Use   Smoking status: Never   Smokeless tobacco: Never  Vaping Use   Vaping Use: Never used  Substance and Sexual Activity   Alcohol use: Yes    Alcohol/week: 2.0 standard drinks    Types: 2 Standard drinks or equivalent per week    Comment: rare drink of wine or beer once a month   Drug use: No   Sexual activity: Yes    Partners: Male    Comment: Monogamous relationship with husband.  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 Married '95. 1 son '01.Marriage is good.    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 Life is good   Chemical engineer Strain: Not on file  Food Insecurity: Not on file  Transportation Needs: Not on file  Physical Activity: Not on file  Stress: Not on file  Social Connections: Not on file  Intimate Partner Violence: Not on file    Current Outpatient Medications on File Prior to Visit  Medication Sig Dispense Refill   irbesartan (AVAPRO) 150 MG tablet Take 1 tablet (150 mg total) by mouth daily. 90 tablet 3   pantoprazole (PROTONIX) 40 MG tablet TAKE 1 TABLET DAILY 90 tablet 3   simvastatin (ZOCOR) 20 MG tablet Take 1 tablet (20 mg total) by mouth at bedtime. 90 tablet 3   No current facility-administered medications on file prior to visit.    No Known Allergies  Family History  Problem Relation Age of Onset   Cancer Mother        breast cancer, 1943   Hypertension Mother    Cancer Father        prostate cancer, 1942   Hyperlipidemia Father    Diabetes Neg Hx  Colon cancer Neg Hx    Esophageal cancer Neg Hx    Stomach cancer Neg Hx     BP (!) 180/100 (BP Location: Right Arm, Patient Position: Sitting, Cuff Size: Normal)   Pulse 90   Ht 5\' 8"  (1.727 m)   Wt 201 lb 9.6 oz (91.4 kg)   BMI 30.65 kg/m    Review of Systems     Objective:   Physical Exam Pulses: dorsalis pedis intact bilat.   MSK: no deformity of the feet.   CV: no leg edema.   Skin:  no ulcer on the feet.  normal color and temp on the feet.   Neuro: sensation is intact to touch on the feet.    A1c=6.9%     Assessment & Plan:  Type 2 DM: uncontrolled (as goal A1c is low 6's).  I advised pt to increase rx, but she declines.

## 2021-03-01 NOTE — Patient Instructions (Signed)
Please continue the same diabetes Janumet.   check your blood sugar twice a week.  vary the time of day when you check, between before the 3 meals, and at bedtime.  also check if you have symptoms of your blood sugar being too high or too low.  please keep a record of the readings and bring it to your next appointment here (or you can bring the meter itself).  You can write it on any piece of paper.  please call us sooner if your blood sugar goes below 70, or if you have a lot of readings over 200.   Please come back for a follow-up appointment in 9 months.

## 2021-03-05 ENCOUNTER — Other Ambulatory Visit: Payer: Self-pay

## 2021-03-05 ENCOUNTER — Encounter: Payer: Self-pay | Admitting: Internal Medicine

## 2021-03-05 ENCOUNTER — Ambulatory Visit (INDEPENDENT_AMBULATORY_CARE_PROVIDER_SITE_OTHER): Payer: Managed Care, Other (non HMO) | Admitting: Internal Medicine

## 2021-03-05 VITALS — BP 160/100 | HR 88 | Temp 98.6°F | Resp 18 | Ht 68.0 in | Wt 199.0 lb

## 2021-03-05 DIAGNOSIS — I10 Essential (primary) hypertension: Secondary | ICD-10-CM

## 2021-03-05 LAB — COMPREHENSIVE METABOLIC PANEL
ALT: 49 U/L — ABNORMAL HIGH (ref 0–35)
AST: 37 U/L (ref 0–37)
Albumin: 4.3 g/dL (ref 3.5–5.2)
Alkaline Phosphatase: 103 U/L (ref 39–117)
BUN: 17 mg/dL (ref 6–23)
CO2: 28 mEq/L (ref 19–32)
Calcium: 9.6 mg/dL (ref 8.4–10.5)
Chloride: 100 mEq/L (ref 96–112)
Creatinine, Ser: 0.63 mg/dL (ref 0.40–1.20)
GFR: 100.99 mL/min (ref >60.00)
Glucose, Bld: 132 mg/dL — ABNORMAL HIGH (ref 70–99)
Potassium: 4.3 mEq/L (ref 3.5–5.1)
Sodium: 138 mEq/L (ref 135–145)
Total Bilirubin: 0.4 mg/dL (ref 0.2–1.2)
Total Protein: 7.3 g/dL (ref 6.0–8.3)

## 2021-03-05 LAB — MAGNESIUM: Magnesium: 1.8 mg/dL (ref 1.5–2.5)

## 2021-03-05 NOTE — Patient Instructions (Addendum)
We will check the labs today. The EKG is normal today.  You can take 1 pill avapro (irbesartan) twice a day. Let us know in about 1-2 weeks how the blood pressure is doing.  We may need to keep the dose higher or we may be able to go back down.  https://www.mata.com/.pdf">  DASH Eating Plan DASH stands for Dietary Approaches to Stop Hypertension. The DASH eating plan is a healthy eating plan that has been shown to: Reduce high blood pressure (hypertension). Reduce your risk for type 2 diabetes, heart disease, and stroke. Help with weight loss. What are tips for following this plan? Reading food labels Check food labels for the amount of salt (sodium) per serving. Choose foods with less than 5 percent of the Daily Value of sodium. Generally, foods with less than 300 milligrams (mg) of sodium per serving fit into this eating plan. To find whole grains, look for the word "whole" as the first word in the ingredient list. Shopping Buy products labeled as "low-sodium" or "no salt added." Buy fresh foods. Avoid canned foods and pre-made or frozen meals. Cooking Avoid adding salt when cooking. Use salt-free seasonings or herbs instead of table salt or sea salt. Check with your health care provider or pharmacist before using salt substitutes. Do not fry foods. Cook foods using healthy methods such as baking, boiling, grilling, roasting, and broiling instead. Cook with heart-healthy oils, such as olive, canola, avocado, soybean, or sunflower oil. Meal planning  Eat a balanced diet that includes: 4 or more servings of fruits and 4 or more servings of vegetables each day. Try to fill one-half of your plate with fruits and vegetables. 6-8 servings of whole grains each day. Less than 6 oz (170 g) of lean meat, poultry, or fish each day. A 3-oz (85-g) serving of meat is about the same size as a deck of cards. One egg equals 1 oz (28 g). 2-3 servings of low-fat  dairy each day. One serving is 1 cup (237 mL). 1 serving of nuts, seeds, or beans 5 times each week. 2-3 servings of heart-healthy fats. Healthy fats called omega-3 fatty acids are found in foods such as walnuts, flaxseeds, fortified milks, and eggs. These fats are also found in cold-water fish, such as sardines, salmon, and mackerel. Limit how much you eat of: Canned or prepackaged foods. Food that is high in trans fat, such as some fried foods. Food that is high in saturated fat, such as fatty meat. Desserts and other sweets, sugary drinks, and other foods with added sugar. Full-fat dairy products. Do not salt foods before eating. Do not eat more than 4 egg yolks a week. Try to eat at least 2 vegetarian meals a week. Eat more home-cooked food and less restaurant, buffet, and fast food.  Lifestyle When eating at a restaurant, ask that your food be prepared with less salt or no salt, if possible. If you drink alcohol: Limit how much you use to: 0-1 drink a day for women who are not pregnant. 0-2 drinks a day for men. Be aware of how much alcohol is in your drink. In the [place] one drink equals one 12 oz bottle of beer (355 mL), one 5 oz glass of wine (148 mL), or one 1 oz glass of hard liquor (44 mL). General information Avoid eating more than 2,300 mg of salt a day. If you have hypertension, you may need to reduce your sodium intake to 1,500 mg a day. Work with your health  care provider to maintain a healthy body weight or to lose weight. Ask what an ideal weight is for you. Get at least 30 minutes of exercise that causes your heart to beat faster (aerobic exercise) most days of the week. Activities may include walking, swimming, or biking. Work with your health care provider or dietitian to adjust your eating plan to your individual calorie needs. What foods should I eat? Fruits All fresh, dried, or frozen fruit. Canned fruit in natural juice (without addedsugar). Vegetables Fresh or  frozen vegetables (raw, steamed, roasted, or grilled). Low-sodium or reduced-sodium tomato and vegetable juice. Low-sodium or reduced-sodium tomatosauce and tomato paste. Low-sodium or reduced-sodium canned vegetables. Grains Whole-grain or whole-wheat bread. Whole-grain or whole-wheat pasta. Brown rice. Orpah Cobb. Bulgur. Whole-grain and low-sodium cereals. Pita bread.Low-fat, low-sodium crackers. Whole-wheat flour tortillas. Meats and other proteins Skinless chicken or [place]. Ground chicken or [place]. Pork with fat trimmed off. Fish and seafood. Egg whites. Dried beans, peas, or lentils. Unsalted nuts, nut butters, and seeds. Unsalted canned beans. Lean cuts of beef with fat trimmed off. Low-sodium, lean precooked or cured meat, such as sausages or meatloaves. Dairy Low-fat (1%) or fat-free (skim) milk. Reduced-fat, low-fat, or fat-free cheeses. Nonfat, low-sodium ricotta or cottage cheese. Low-fat or nonfatyogurt. Low-fat, low-sodium cheese. Fats and oils Soft margarine without trans fats. Vegetable oil. Reduced-fat, low-fat, or light mayonnaise and salad dressings (reduced-sodium). Canola, safflower, olive, avocado, soybean, andsunflower oils. Avocado. Seasonings and condiments Herbs. Spices. Seasoning mixes without salt. Other foods Unsalted popcorn and pretzels. Fat-free sweets. The items listed above may not be a complete list of foods and beverages you can eat. Contact a dietitian for more information. What foods should I avoid? Fruits Canned fruit in a light or heavy syrup. Fried fruit. Fruit in cream or buttersauce. Vegetables Creamed or fried vegetables. Vegetables in a cheese sauce. Regular canned vegetables (not low-sodium or reduced-sodium). Regular canned tomato sauce and paste (not low-sodium or reduced-sodium). Regular tomato and vegetable juice(not low-sodium or reduced-sodium). Rosita Fire. Olives. Grains Baked goods made with fat, such as croissants, muffins, or some  breads. Drypasta or rice meal packs. Meats and other proteins Fatty cuts of meat. Ribs. Fried meat. Tomasa Blase. Bologna, salami, and other precooked or cured meats, such as sausages or meat loaves. Fat from the back of a pig (fatback). Bratwurst. Salted nuts and seeds. Canned beans with added salt. Canned orsmoked fish. Whole eggs or egg yolks. Chicken or [place] with skin. Dairy Whole or 2% milk, cream, and half-and-half. Whole or full-fat cream cheese. Whole-fat or sweetened yogurt. Full-fat cheese. Nondairy creamers. Whippedtoppings. Processed cheese and cheese spreads. Fats and oils Butter. Stick margarine. Lard. Shortening. Ghee. Bacon fat. Tropical oils, suchas coconut, palm kernel, or palm oil. Seasonings and condiments Onion salt, garlic salt, seasoned salt, table salt, and sea salt. Worcestershire sauce. Tartar sauce. Barbecue sauce. Teriyaki sauce. Soy sauce, including reduced-sodium. Steak sauce. Canned and packaged gravies. Fish sauce. Oyster sauce. Cocktail sauce. Store-bought horseradish. Ketchup. Mustard. Meat flavorings and tenderizers. Bouillon cubes. Hot sauces. Pre-made or packaged marinades. Pre-made or packaged taco seasonings. Relishes. Regular saladdressings. Other foods Salted popcorn and pretzels. The items listed above may not be a complete list of foods and beverages you should avoid. Contact a dietitian for more information. Where to find more information National Heart, Lung, and Blood Institute: PopSteam.is American Heart Association: www.heart.org Academy of Nutrition and Dietetics: www.eatright.org National Kidney Foundation: www.kidney.org Summary The DASH eating plan is a healthy eating plan that has been shown to reduce  high blood pressure (hypertension). It may also reduce your risk for type 2 diabetes, heart disease, and stroke. When on the DASH eating plan, aim to eat more fresh fruits and vegetables, whole grains, lean proteins, low-fat dairy, and  heart-healthy fats. With the DASH eating plan, you should limit salt (sodium) intake to 2,300 mg a day. If you have hypertension, you may need to reduce your sodium intake to 1,500 mg a day. Work with your health care provider or dietitian to adjust your eating plan to your individual calorie needs. This information is not intended to replace advice given to you by your health care provider. Make sure you discuss any questions you have with your healthcare provider. Document Revised: 07/30/2019 Document Reviewed: 07/30/2019 Elsevier Patient Education  2022 [employer].

## 2021-03-05 NOTE — Progress Notes (Signed)
   Subjective:   Patient ID: Elizabeth Alvarado, female    DOB: 1966-01-14, 55 y.o.   MRN: [id number]  HPI The patient is a 55 YO female coming in for high BP. Had high reading last night to 179/101 and headache. No headache today. Feels meds may not be working well anymore. No change to diet or exercise. No chest pain.  Review of Systems  Constitutional: Negative.   HENT: Negative.    Eyes: Negative.   Respiratory:  Negative for cough, chest tightness and shortness of breath.   Cardiovascular:  Negative for chest pain, palpitations and leg swelling.  Gastrointestinal:  Negative for abdominal distention, abdominal pain, constipation, diarrhea, nausea and vomiting.  Musculoskeletal: Negative.   Skin: Negative.   Neurological:  Positive for headaches.  Psychiatric/Behavioral: Negative.     Objective:  Physical Exam Constitutional:      Appearance: She is well-developed.  HENT:     Head: Normocephalic and atraumatic.  Cardiovascular:     Rate and Rhythm: Normal rate and regular rhythm.  Pulmonary:     Effort: Pulmonary effort is normal. No respiratory distress.     Breath sounds: Normal breath sounds. No wheezing or rales.  Abdominal:     General: Bowel sounds are normal. There is no distension.     Palpations: Abdomen is soft.     Tenderness: There is no abdominal tenderness. There is no rebound.  Musculoskeletal:     Cervical back: Normal range of motion.  Skin:    General: Skin is warm and dry.  Neurological:     Mental Status: She is alert and oriented to person, place, and time.     Coordination: Coordination normal.    Vitals:   03/05/21 1022  BP: (!) 160/100  Pulse: 88  Resp: 18  Temp: 98.6 F (37 C)  TempSrc: Oral  SpO2: 98%  Weight: 199 lb (90.3 kg)  Height: 5\' 8"  (1.727 m)   EKG: Rate 83, axis normal, interval long QTc 493, sinus, some PAC, flat or inverted t wave V1-V6 st or t wave changes, long QTc is new finding compared to prior 2010, ST wave  changes are not new   This visit occurred during the SARS-CoV-2 public health emergency.  Safety protocols were in place, including screening questions prior to the visit, additional usage of staff PPE, and extensive cleaning of exam room while observing appropriate contact time as indicated for disinfecting solutions.   Assessment & Plan:

## 2021-03-07 ENCOUNTER — Ambulatory Visit: Payer: Managed Care, Other (non HMO) | Admitting: Internal Medicine

## 2021-03-08 NOTE — Assessment & Plan Note (Signed)
BP high, EKG done today without change. Checking CMP and magnesium due to new borderline QTc. We will have her double irbesartan to 300 mg daily. She will do this for 1-2 weeks and then let us know readings and we can change rx if effective.

## 2021-04-24 ENCOUNTER — Encounter: Payer: Self-pay | Admitting: Endocrinology

## 2021-04-24 LAB — HM DIABETES EYE EXAM

## 2021-05-25 ENCOUNTER — Telehealth: Payer: Self-pay

## 2021-05-25 ENCOUNTER — Telehealth (INDEPENDENT_AMBULATORY_CARE_PROVIDER_SITE_OTHER): Payer: Managed Care, Other (non HMO) | Admitting: Internal Medicine

## 2021-05-25 ENCOUNTER — Encounter: Payer: Self-pay | Admitting: Internal Medicine

## 2021-05-25 DIAGNOSIS — R21 Rash and other nonspecific skin eruption: Secondary | ICD-10-CM

## 2021-05-25 MED ORDER — PREDNISONE 20 MG PO TABS: 40.0000 mg | ORAL_TABLET | Freq: Every day | ORAL | 0 refills | Status: DC

## 2021-05-25 NOTE — Progress Notes (Signed)
Virtual Visit via Video Note  I connected with Elizabeth Alvarado on 05/25/21 at  2:40 PM EDT by a video enabled telemedicine application and verified that I am speaking with the correct person using two identifiers.  The patient and the provider were at separate locations throughout the entire encounter. Patient location: home, Provider location: work   I discussed the limitations of evaluation and management by telemedicine and the availability of in person appointments. The patient expressed understanding and agreed to proceed. The patient and the provider were the only parties present for the visit unless noted in HPI below.  History of Present Illness: The patient is a 55 y.o. female with visit for poison ivy. Started Sunday when clearing out weeds. Has rash and itching on the forehead and neck and hands. Denies change in vision, swelling around eyes or mouth or in mouth. Overall it is not improving. Has tried otc without relief  Observations/Objective: Appearance: normal, swelling and rash consistent with poison ivy/oak on the right neck and left forehead and bilateral hands, breathing appears normal, casual grooming, abdomen does not appear distended, throat without swelling and tongue normal size, no rash around the mouth, mental status is A and O times 3  Assessment and Plan: See problem oriented charting  Follow Up Instructions: rx prednisone, avoid poison ivy/oak and wear protective clothes when doing yardwork  I discussed the assessment and treatment plan with the patient. The patient was provided an opportunity to ask questions and all were answered. The patient agreed with the plan and demonstrated an understanding of the instructions.   The patient was advised to call back or seek an in-person evaluation if the symptoms worsen or if the condition fails to improve as anticipated.  Myrlene Broker, MD

## 2021-05-25 NOTE — Telephone Encounter (Signed)
See below

## 2021-05-25 NOTE — Telephone Encounter (Signed)
Patient called requesting a medication be sent in for poison ivy. The reaction is all over her hands and face.

## 2021-05-25 NOTE — Telephone Encounter (Signed)
Can she do a virtual this afternoon, okay to add on?

## 2021-05-25 NOTE — Telephone Encounter (Signed)
Spoke with the patient and she is being added on to the schedule at 2:40 pm. Pt is aware of appt time.

## 2021-05-25 NOTE — Assessment & Plan Note (Signed)
Due to poison oak/ivy and rx prednisone given the rash is on the face and neck with swelling. Advised that this may temporarily raise sugar levels (she is diabetic last HgA1c 6.9).

## 2021-06-04 ENCOUNTER — Telehealth: Payer: Self-pay | Admitting: Internal Medicine

## 2021-06-04 MED ORDER — PREDNISONE 10 MG PO TABS: ORAL_TABLET | ORAL | 0 refills | Status: DC

## 2021-06-04 NOTE — Telephone Encounter (Signed)
New prescription sent to pharmacy - dosing is a little different

## 2021-06-04 NOTE — Telephone Encounter (Signed)
1.Medication Requested: predniSONE (DELTASONE) 20 MG tablet  2. Pharmacy (Name, Street, [facility]): [facility] [phone] - [address] [place], [place] [address] [place] [address] AT [facility] [address] [facility] [address]  Phone:  [phone] Fax:  [phone]   3. On Med List: yes  4. Last Visit with PCP: 09.16.22  5. Next visit date with PCP: n/a  **Patient says she still has the poison ivy & it has spread since VV w/ provider on 09/16**   Agent: Please be advised that RX refills may take up to 3 business days. We ask that you follow-up with your pharmacy.

## 2021-09-18 ENCOUNTER — Telehealth: Payer: Self-pay | Admitting: Internal Medicine

## 2021-09-18 DIAGNOSIS — E1169 Type 2 diabetes mellitus with other specified complication: Secondary | ICD-10-CM

## 2021-09-18 NOTE — Telephone Encounter (Signed)
Patient called in to make CPE appt  Patient would like labs to be done & back before appt.. please place lab order & let patient know when this has been done  Please call [phone]

## 2021-09-18 NOTE — Telephone Encounter (Signed)
See below

## 2021-09-20 NOTE — Telephone Encounter (Signed)
Orders placed.

## 2021-09-20 NOTE — Telephone Encounter (Signed)
Pt is aware that lab orders have been placed.  °

## 2021-10-03 ENCOUNTER — Other Ambulatory Visit (INDEPENDENT_AMBULATORY_CARE_PROVIDER_SITE_OTHER): Payer: Managed Care, Other (non HMO)

## 2021-10-03 DIAGNOSIS — E1169 Type 2 diabetes mellitus with other specified complication: Secondary | ICD-10-CM | POA: Diagnosis not present

## 2021-10-03 LAB — COMPREHENSIVE METABOLIC PANEL
ALT: 48 U/L — ABNORMAL HIGH (ref 0–35)
AST: 38 U/L — ABNORMAL HIGH (ref 0–37)
Albumin: 4.2 g/dL (ref 3.5–5.2)
Alkaline Phosphatase: 106 U/L (ref 39–117)
BUN: 14 mg/dL (ref 6–23)
CO2: 29 mEq/L (ref 19–32)
Calcium: 9.6 mg/dL (ref 8.4–10.5)
Chloride: 101 mEq/L (ref 96–112)
Creatinine, Ser: 0.68 mg/dL (ref 0.40–1.20)
GFR: 98.75 mL/min (ref >60.00)
Glucose, Bld: 120 mg/dL — ABNORMAL HIGH (ref 70–99)
Potassium: 4.8 mEq/L (ref 3.5–5.1)
Sodium: 137 mEq/L (ref 135–145)
Total Bilirubin: 0.5 mg/dL (ref 0.2–1.2)
Total Protein: 7.3 g/dL (ref 6.0–8.3)

## 2021-10-03 LAB — LIPID PANEL
Cholesterol: 133 mg/dL (ref 0–200)
HDL: 39.9 mg/dL (ref >39.00)
LDL Cholesterol: 67 mg/dL (ref 0–99)
NonHDL: 92.68
Total CHOL/HDL Ratio: 3
Triglycerides: 127 mg/dL (ref 0.0–149.0)
VLDL: 25.4 mg/dL (ref 0.0–40.0)

## 2021-10-03 LAB — CBC
HCT: 38.8 % (ref 36.0–46.0)
Hemoglobin: 12.9 g/dL (ref 12.0–15.0)
MCHC: 33.2 g/dL (ref 30.0–36.0)
MCV: 86.8 fl (ref 78.0–100.0)
Platelets: 308 10*3/uL (ref 150.0–400.0)
RBC: 4.47 Mil/uL (ref 3.87–5.11)
RDW: 13.9 % (ref 11.5–15.5)
WBC: 7.7 10*3/uL (ref 4.0–10.5)

## 2021-10-03 LAB — HEMOGLOBIN A1C: Hgb A1c MFr Bld: 7 % — ABNORMAL HIGH (ref 4.6–6.5)

## 2021-10-03 LAB — MICROALBUMIN / CREATININE URINE RATIO
Creatinine,U: 73.9 mg/dL
Microalb Creat Ratio: 1 mg/g (ref 0.0–30.0)
Microalb, Ur: 0.7 mg/dL (ref 0.0–1.9)

## 2021-10-12 ENCOUNTER — Other Ambulatory Visit: Payer: Self-pay

## 2021-10-12 ENCOUNTER — Ambulatory Visit (INDEPENDENT_AMBULATORY_CARE_PROVIDER_SITE_OTHER): Payer: Managed Care, Other (non HMO) | Admitting: Internal Medicine

## 2021-10-12 ENCOUNTER — Encounter: Payer: Self-pay | Admitting: Internal Medicine

## 2021-10-12 VITALS — BP 130/88 | HR 88 | Temp 98.0°F | Ht 68.0 in | Wt 188.4 lb

## 2021-10-12 DIAGNOSIS — Z0000 Encounter for general adult medical examination without abnormal findings: Secondary | ICD-10-CM

## 2021-10-12 DIAGNOSIS — E1169 Type 2 diabetes mellitus with other specified complication: Secondary | ICD-10-CM | POA: Diagnosis not present

## 2021-10-12 DIAGNOSIS — Z1211 Encounter for screening for malignant neoplasm of colon: Secondary | ICD-10-CM | POA: Diagnosis not present

## 2021-10-12 DIAGNOSIS — R0683 Snoring: Secondary | ICD-10-CM | POA: Diagnosis not present

## 2021-10-12 DIAGNOSIS — E785 Hyperlipidemia, unspecified: Secondary | ICD-10-CM

## 2021-10-12 DIAGNOSIS — I10 Essential (primary) hypertension: Secondary | ICD-10-CM

## 2021-10-12 NOTE — Assessment & Plan Note (Signed)
Taking simvastatin 20 mg daily and at goal on recent labs.

## 2021-10-12 NOTE — Patient Instructions (Signed)
We will get the sleep test and the cologuard done.  Think about the tetanus shot.

## 2021-10-12 NOTE — Assessment & Plan Note (Signed)
Flu shot declines. Covid-19 booster counseled. Shingrix complete. Tetanus declines. Cologuard ordered. Mammogram up to date, pap smear up to date. Counseled about sun safety and mole surveillance. Counseled about the dangers of distracted driving. Given 10 year screening recommendations.

## 2021-10-12 NOTE — Assessment & Plan Note (Signed)
Ordered home sleep test to rule out OSA, husband thinks she may stop breathing in sleep.

## 2021-10-12 NOTE — Progress Notes (Signed)
° °  Subjective:   Patient ID: Elizabeth Alvarado, female    DOB: November 29, 1965, 56 y.o.   MRN: [id number]  HPI The patient is a 56 YO female coming in for physical.   PMH, FMH, social history reviewed and updated  Review of Systems  Constitutional: Negative.   HENT: Negative.    Eyes: Negative.   Respiratory:  Negative for cough, chest tightness and shortness of breath.   Cardiovascular:  Negative for chest pain, palpitations and leg swelling.  Gastrointestinal:  Negative for abdominal distention, abdominal pain, constipation, diarrhea, nausea and vomiting.  Musculoskeletal: Negative.   Skin: Negative.   Neurological: Negative.   Psychiatric/Behavioral: Negative.     Objective:  Physical Exam Constitutional:      Appearance: She is well-developed.  HENT:     Head: Normocephalic and atraumatic.  Cardiovascular:     Rate and Rhythm: Normal rate and regular rhythm.  Pulmonary:     Effort: Pulmonary effort is normal. No respiratory distress.     Breath sounds: Normal breath sounds. No wheezing or rales.  Abdominal:     General: Bowel sounds are normal. There is no distension.     Palpations: Abdomen is soft.     Tenderness: There is no abdominal tenderness. There is no rebound.  Musculoskeletal:     Cervical back: Normal range of motion.  Skin:    General: Skin is warm and dry.     Comments: Foot exam done  Neurological:     Mental Status: She is alert and oriented to person, place, and time.     Coordination: Coordination normal.    Vitals:   10/12/21 0801  BP: 130/88  Pulse: 88  Temp: 98 F (36.7 C)  TempSrc: Oral  SpO2: 98%  Weight: 188 lb 6.4 oz (85.5 kg)  Height: 5\' 8"  (1.727 m)    This visit occurred during the SARS-CoV-2 public health emergency.  Safety protocols were in place, including screening questions prior to the visit, additional usage of staff PPE, and extensive cleaning of exam room while observing appropriate contact time as indicated for  disinfecting solutions.   Assessment & Plan:

## 2021-10-12 NOTE — Assessment & Plan Note (Signed)
BP at goal on irbesartan 150 mg daily. Recent CMP without indication for change will continue.

## 2021-10-12 NOTE — Assessment & Plan Note (Signed)
Foot exam done. HgA1c at goal on recent labs. Continue metformin/sitagliptin 1000/50 daily. Is on statin and ARB. Microalbumin to creatinine ratio up to date. Eye exam up to date.

## 2021-10-14 ENCOUNTER — Other Ambulatory Visit: Payer: Self-pay | Admitting: Internal Medicine

## 2021-10-17 ENCOUNTER — Telehealth: Payer: Self-pay

## 2021-10-17 NOTE — Telephone Encounter (Signed)
See refill encounter

## 2021-10-17 NOTE — Telephone Encounter (Signed)
Pt is requesting a refill on: irbesartan (AVAPRO) 150 MG tablet simvastatin (ZOCOR) 20 MG tablet pantoprazole (PROTONIX) 40 MG tablet. PHARMACY: [facility]  SitaGLIPtin-MetFORMIN HCl (JANUMET XR) 50-1000 MG TB24- Prescribing provider is Dr. Renato Shin but pt stated that Dr. Sharlet Salina said she can refill it. PHARMACY: [facility][id number] - [place], [place] - [address] [place] [address]  LOV: 10/12/21  Pt CB [phone]

## 2021-12-04 ENCOUNTER — Encounter (HOSPITAL_BASED_OUTPATIENT_CLINIC_OR_DEPARTMENT_OTHER): Payer: Managed Care, Other (non HMO) | Admitting: Internal Medicine

## 2021-12-21 ENCOUNTER — Ambulatory Visit (HOSPITAL_BASED_OUTPATIENT_CLINIC_OR_DEPARTMENT_OTHER): Payer: Managed Care, Other (non HMO) | Attending: Internal Medicine | Admitting: Internal Medicine

## 2021-12-21 DIAGNOSIS — R0683 Snoring: Secondary | ICD-10-CM | POA: Diagnosis present

## 2021-12-21 DIAGNOSIS — G4733 Obstructive sleep apnea (adult) (pediatric): Secondary | ICD-10-CM | POA: Diagnosis not present

## 2021-12-25 ENCOUNTER — Encounter: Payer: Self-pay | Admitting: Endocrinology

## 2021-12-25 ENCOUNTER — Ambulatory Visit (INDEPENDENT_AMBULATORY_CARE_PROVIDER_SITE_OTHER): Payer: Managed Care, Other (non HMO) | Admitting: Endocrinology

## 2021-12-25 VITALS — BP 142/78 | HR 77 | Ht 68.0 in | Wt 187.4 lb

## 2021-12-25 DIAGNOSIS — E1169 Type 2 diabetes mellitus with other specified complication: Secondary | ICD-10-CM

## 2021-12-25 LAB — POCT GLYCOSYLATED HEMOGLOBIN (HGB A1C): Hemoglobin A1C: 6.1 % — AB (ref 4.0–5.6)

## 2021-12-25 MED ORDER — METFORMIN HCL ER 500 MG PO TB24: 1000.0000 mg | ORAL_TABLET | Freq: Every day | ORAL | 1 refills | Status: DC

## 2021-12-25 MED ORDER — RYBELSUS 3 MG PO TABS: 3.0000 mg | ORAL_TABLET | Freq: Every day | ORAL | 11 refills | Status: DC

## 2021-12-25 NOTE — Patient Instructions (Signed)
I have sent prescriptions to your pharmacy, to change the Janumet to 2 different meds.   ?check your blood sugar twice a week.  vary the time of day when you check, between before the 3 meals, and at bedtime.  also check if you have symptoms of your blood sugar being too high or too low.  please keep a record of the readings and bring it to your next appointment here (or you can bring the meter itself).  You can write it on any piece of paper.  please call us sooner if your blood sugar goes below 70, or if you have a lot of readings over 200.   ?You should have an endocrinology follow-up appointment in 6 months.    ?

## 2021-12-25 NOTE — Progress Notes (Signed)
? ?Subjective:  ? ? Patient ID: Elizabeth Alvarado, female    DOB: [birth year], 56 y.o.   MRN: [id number] ? ?HPI ?Pt returns for f/u of diabetes mellitus:  ?DM type: 2 ?Dx'ed: 2018 ?Complications: none.   ?Therapy: Janumet.    ?GDM: 2001 ?DKA: never ?Severe hypoglycemia: never ?Pancreatitis: never ?Pancreatic imaging: normal on 2012 CT.   ?Other: She declines weight loss surgery; she has never been on insulin.   ?Interval history: pt states she feels well in general.  She takes medication as rx'ed.  She says cbgs are well-controlled.  Pt says Janumet causes vaginitis.     ?Past Medical History:  ?Diagnosis Date  ? Diabetes mellitus without complication ([place])   ? DILATION AND CURETTAGE, HX OF 08/25/2007  ? GERD (gastroesophageal reflux disease)   ? Hyperlipidemia   ? RENAL CALCULUS, HX OF 08/25/2007  ? Ureteral stone 01/09/2011  ? UTI'S, HX OF 08/25/2007  ? ? ?Past Surgical History:  ?Procedure Laterality Date  ? BACK SURGERY    ? BREAST BIOPSY Left   ? CERVIX LESION DESTRUCTION  April '12  ? minor abnormal PAP; "hot ballon application"  ? DILATION AND CURETTAGE OF UTERUS    ? hx of  ? LITHOTRIPSY    ? ? ?Social History  ? ?Socioeconomic History  ? Marital status: Married  ?  Spouse name: Not on file  ? Number of children: 1  ? Years of education: 57  ? Highest education level: Not on file  ?Occupational History  ? 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  ?  Employer: [employer]  ?  Comment: [employer], [place] [employer] acctg.  ?Tobacco Use  ? Smoking status: Never  ? Smokeless tobacco: Never  ?Vaping Use  ? Vaping Use: Never used  ?Substance and Sexual Activity  ? Alcohol use: Yes  ?  Alcohol/week: 2.0 standard drinks  ?  Types: 2 Standard drinks or equivalent per week  ?  Comment: rare drink of wine or beer once a month  ? Drug use: No  ? Sexual activity: Yes  ?  Partners: Male  ?  Comment: Monogamous relationship with husband.  ?Other Topics Concern  ? Not on file  ?Social History Narrative  ? HS[employer],  [employer] - 2 year accounting degree. Married '95. 1 son '01.Marriage is good.   ? 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 Life is good  ? ?Social Determinants of Health  ? ?Financial Resource Strain: Not on file  ?Food Insecurity: Not on file  ?Transportation Needs: Not on file  ?Physical Activity: Not on file  ?Stress: Not on file  ?Social Connections: Not on file  ?Intimate Partner Violence: Not on file  ? ? ?Current Outpatient Medications on File Prior to Visit  ?Medication Sig Dispense Refill  ? irbesartan (AVAPRO) 150 MG tablet TAKE 1 TABLET DAILY 90 tablet 3  ? pantoprazole (PROTONIX) 40 MG tablet TAKE 1 TABLET DAILY 90 tablet 3  ? simvastatin (ZOCOR) 20 MG tablet TAKE 1 TABLET AT BEDTIME 90 tablet 3  ? ?No current facility-administered medications on file prior to visit.  ? ? ?No Known Allergies ? ?Family History  ?Problem Relation Age of Onset  ? Cancer Mother   ?     breast cancer, 1943  ? Hypertension Mother   ? Cancer Father   ?     prostate cancer, 1942  ? Hyperlipidemia Father   ? Diabetes Neg Hx   ? Colon cancer Neg Hx   ?  Esophageal cancer Neg Hx   ? Stomach cancer Neg Hx   ? ? ?BP (!) 142/78 (BP Location: Left Arm, Patient Position: Sitting, Cuff Size: Normal)   Pulse 77   Ht 5\' 8"  (1.727 m)   Wt 187 lb 6.4 oz (85 kg)   LMP 10/14/2017   SpO2 98%   BMI 28.49 kg/m?  ? ? ?Review of Systems ?HB is mild.   ?   ?Objective:  ? Physical Exam ?VITAL SIGNS:  See vs page ?GENERAL: no distress.   ? ? ?Lab Results  ?Component Value Date  ? HGBA1C 6.1 (A) 12/25/2021  ? ?   ?Assessment & Plan:  ?Type 2 DM: well-controlled ?HB, due to Janumet.  However, it is unlikely that this causing vaginitis.   ? ?Patient Instructions  ?I have sent prescriptions to your pharmacy, to change the Janumet to 2 different meds.   ?check your blood sugar twice a week.  vary the time of day when you check, between before the 3 meals, and at bedtime.  also check if you have symptoms of your blood sugar being too high  or too low.  please keep a record of the readings and bring it to your next appointment here (or you can bring the meter itself).  You can write it on any piece of paper.  please call us sooner if your blood sugar goes below 70, or if you have a lot of readings over 200.   ?You should have an endocrinology follow-up appointment in 6 months.    ? ? ?

## 2021-12-29 DIAGNOSIS — R0683 Snoring: Secondary | ICD-10-CM | POA: Diagnosis not present

## 2021-12-29 NOTE — Procedures (Addendum)
? ?  Patient Name: Elizabeth Alvarado, Elizabeth Alvarado ?Study Date: 12/22/2021 ?Gender: Female ?D.O.B: 1966-09-02 ?Age (years): 4 ?Referring Provider: Hillard Danker ?Height (inches): 66 ?Interpreting Physician: Jetty Duhamel MD, ABSM ?Weight (lbs): 184 ?RPSGT: [place] ?BMI: 30 ?MRN: [id number] ?Neck Size: 15.25 ?<br> <br> ?CLINICAL INFORMATION ?Sleep Study Type: HST ? ? ? ?Indication for sleep study: OSA ? ? ? ?Epworth Sleepiness Score: 6 ? ?SLEEP STUDY TECHNIQUE ?A multi-channel overnight portable sleep study was performed. The channels recorded were: nasal airflow, thoracic respiratory movement, and oxygen saturation with a pulse oximetry. Snoring was also monitored. ? ?MEDICATIONS ?Patient self administered medications include: none reported. ? ?SLEEP ARCHITECTURE ?Patient was studied for 373.5 minutes. The sleep efficiency was 100.0 % and the patient was supine for 46.7%. The arousal index was 0.0 per hour. ? ?RESPIRATORY PARAMETERS ?The overall AHI was 38.7 per hour, with a central apnea index of 0 per hour. ? ?The oxygen nadir was 73% during sleep. ? ? ? ?CARDIAC DATA ?Mean heart rate during sleep was 85.0 bpm. ? ?IMPRESSIONS ?- Severe obstructive sleep apnea occurred during this study (AHI = 38.7/h). ?- Severe oxygen desaturation was noted during this study (Min O2 = 73%).  Mean 90%. Time with O2 saturation 89% or less was 74.4 minutes. ?- Patient snored. ?DIAGNOSIS ?- Obstructive Sleep Apnea (G47.33) ?- Nocturnal Hypoxemia (G47.36) ?RECOMMENDATIONS ?- Suggest CPAP titration sleep study or autopap. Other options would be based on clinical judgment. ?- Be careful with alcohol, sedatives and other CNS depressants that may worsen sleep apnea and disrupt normal sleep architecture. ?- Sleep hygiene should be reviewed to assess factors that may improve sleep quality. ?- Weight management and regular exercise should be initiated or continued. ? ?[Electronically signed] 12/29/2021 11:35 AM ? ?Jetty Duhamel MD,  ABSM ?Diplomate, Biomedical engineer of Sleep Medicine ?NPI: [id number] ?

## 2022-01-08 ENCOUNTER — Telehealth: Payer: Self-pay | Admitting: Internal Medicine

## 2022-01-08 DIAGNOSIS — G4733 Obstructive sleep apnea (adult) (pediatric): Secondary | ICD-10-CM | POA: Insufficient documentation

## 2022-01-08 NOTE — Telephone Encounter (Signed)
Pt requesting a cb regarding 12-26-2021 sleep study results ?

## 2022-01-08 NOTE — Telephone Encounter (Signed)
She did have severe sleep apnea. The options would be to do an auto CPAP which senses what pressure she needs automatically (we can order this now) or to do a sleep study where they adjust the pressure and find the right settings for her (we could order this and she would have to do an overnight sleep study).  ?

## 2022-01-09 NOTE — Telephone Encounter (Signed)
Spoke with the patient and she would like to do a auto cpap. No other questions or concerns.  ?

## 2022-01-10 NOTE — Telephone Encounter (Signed)
Ordered

## 2022-01-10 NOTE — Addendum Note (Signed)
Addended by: Hillard Danker A on: 01/10/2022 07:51 AM ? ? Modules accepted: Orders ? ?

## 2022-01-14 ENCOUNTER — Encounter: Payer: Self-pay | Admitting: Internal Medicine

## 2022-01-21 NOTE — Telephone Encounter (Signed)
Pt is calling to check the status of her CPAP order. I reached out to [facility] in [place] at [phone] and he stated that they haven't received anything for this pt.  ? ?Please advise ?

## 2022-01-21 NOTE — Telephone Encounter (Signed)
Community message has been sent to [facility]. Waiting for a reply  ?

## 2022-02-03 LAB — COLOGUARD: COLOGUARD: POSITIVE — AB

## 2022-02-05 ENCOUNTER — Other Ambulatory Visit: Payer: Self-pay | Admitting: Internal Medicine

## 2022-02-05 DIAGNOSIS — R195 Other fecal abnormalities: Secondary | ICD-10-CM

## 2022-02-13 ENCOUNTER — Encounter: Payer: Self-pay | Admitting: Gastroenterology

## 2022-03-14 ENCOUNTER — Ambulatory Visit
Admission: RE | Admit: 2022-03-14 | Discharge: 2022-03-14 | Disposition: A | Discharge status: Home / Self Care | Payer: Managed Care, Other (non HMO) | Source: Ambulatory Visit | Attending: Obstetrics and Gynecology | Admitting: Obstetrics and Gynecology

## 2022-03-14 ENCOUNTER — Other Ambulatory Visit: Payer: Self-pay | Admitting: Obstetrics and Gynecology

## 2022-03-14 DIAGNOSIS — Z1231 Encounter for screening mammogram for malignant neoplasm of breast: Secondary | ICD-10-CM

## 2022-03-28 ENCOUNTER — Ambulatory Visit: Payer: Managed Care, Other (non HMO) | Admitting: *Deleted

## 2022-03-28 VITALS — Ht 67.0 in | Wt 185.0 lb

## 2022-03-28 DIAGNOSIS — R195 Other fecal abnormalities: Secondary | ICD-10-CM

## 2022-03-28 MED ORDER — NA SULFATE-K SULFATE-MG SULF 17.5-3.13-1.6 GM/177ML PO SOLN: 2.0000 | Freq: Once | ORAL | 0 refills | Status: AC

## 2022-03-28 NOTE — Progress Notes (Signed)

## 2022-04-10 ENCOUNTER — Encounter: Payer: Self-pay | Admitting: Gastroenterology

## 2022-04-15 ENCOUNTER — Ambulatory Visit (AMBULATORY_SURGERY_CENTER): Payer: Managed Care, Other (non HMO) | Admitting: Gastroenterology

## 2022-04-15 ENCOUNTER — Encounter: Payer: Self-pay | Admitting: Gastroenterology

## 2022-04-15 VITALS — BP 138/76 | HR 80 | Temp 97.3°F | Resp 11 | Ht 68.0 in | Wt 185.0 lb

## 2022-04-15 DIAGNOSIS — K649 Unspecified hemorrhoids: Secondary | ICD-10-CM

## 2022-04-15 DIAGNOSIS — R195 Other fecal abnormalities: Secondary | ICD-10-CM

## 2022-04-15 MED ORDER — SODIUM CHLORIDE 0.9 % IV SOLN: 500.0000 mL | Freq: Once | INTRAVENOUS | Status: DC

## 2022-04-15 NOTE — Progress Notes (Signed)
Report to PACU, RN, vss, BBS= Clear.  

## 2022-04-15 NOTE — Patient Instructions (Signed)
Please read handouts provided. Continue present medications. Repeat colonoscopy in 10 years for screening.   YOU HAD AN ENDOSCOPIC PROCEDURE TODAY AT THE [facility]:   Refer to the procedure report that was given to you for any specific questions about what was found during the examination.  If the procedure report does not answer your questions, please call your gastroenterologist to clarify.  If you requested that your care partner not be given the details of your procedure findings, then the procedure report has been included in a sealed envelope for you to review at your convenience later.  YOU SHOULD EXPECT: Some feelings of bloating in the abdomen. Passage of more gas than usual.  Walking can help get rid of the air that was put into your GI tract during the procedure and reduce the bloating. If you had a lower endoscopy (such as a colonoscopy or flexible sigmoidoscopy) you may notice spotting of blood in your stool or on the toilet paper. If you underwent a bowel prep for your procedure, you may not have a normal bowel movement for a few days.  Please Note:  You might notice some irritation and congestion in your nose or some drainage.  This is from the oxygen used during your procedure.  There is no need for concern and it should clear up in a day or so.  SYMPTOMS TO REPORT IMMEDIATELY:  Following lower endoscopy (colonoscopy or flexible sigmoidoscopy):  Excessive amounts of blood in the stool  Significant tenderness or worsening of abdominal pains  Swelling of the abdomen that is new, acute  Fever of 100F or higher.  For urgent or emergent issues, a gastroenterologist can be reached at any hour by calling [phone]. Do not use MyChart messaging for urgent concerns.    DIET:  We do recommend a small meal at first, but then you may proceed to your regular diet.  Drink plenty of fluids but you should avoid alcoholic beverages for 24 hours.  ACTIVITY:  You should  plan to take it easy for the rest of today and you should NOT DRIVE or use heavy machinery until tomorrow (because of the sedation medicines used during the test).    FOLLOW UP: Our staff will call the number listed on your records the next business day following your procedure.  We will call around 7:15- 8:00 am to check on you and address any questions or concerns that you may have regarding the information given to you following your procedure. If we do not reach you, we will leave a message.  If you develop any symptoms (ie: fever, flu-like symptoms, shortness of breath, cough etc.) before then, please call [phone].  If you test positive for Covid 19 in the 2 weeks post procedure, please call and report this information to us.    If any biopsies were taken you will be contacted by phone or by letter within the next 1-3 weeks.  Please call us at [phone] if you have not heard about the biopsies in 3 weeks.    SIGNATURES/CONFIDENTIALITY: You and/or your care partner have signed paperwork which will be entered into your electronic medical record.  These signatures attest to the fact that that the information above on your After Visit Summary has been reviewed and is understood.  Full responsibility of the confidentiality of this discharge information lies with you and/or your care-partner.  

## 2022-04-15 NOTE — Op Note (Signed)
[facility] Patient Name: Elizabeth Alvarado Procedure Date: 04/15/2022 8:01 AM MRN: [id number] Endoscopist: Meryl Dare , MD Age: 56 Referring MD:  Date of Birth: 1966-01-28 Gender: Female Account #: [id number] Procedure:                Colonoscopy Indications:              Positive Cologuard test Medicines:                Monitored Anesthesia Care Procedure:                Pre-Anesthesia Assessment:                           - Prior to the procedure, a History and Physical                            was performed, and patient medications and                            allergies were reviewed. The patient's tolerance of                            previous anesthesia was also reviewed. The risks                            and benefits of the procedure and the sedation                            options and risks were discussed with the patient.                            All questions were answered, and informed consent                            was obtained. Prior Anticoagulants: The patient has                            taken no previous anticoagulant or antiplatelet                            agents. ASA Grade Assessment: II - A patient with                            mild systemic disease. After reviewing the risks                            and benefits, the patient was deemed in                            satisfactory condition to undergo the procedure.                           After obtaining informed consent, the colonoscope  was passed under direct vision. Throughout the                            procedure, the patient's blood pressure, pulse, and                            oxygen saturations were monitored continuously. The                            Olympus CF-HQ190L (Serial# [id number]) Colonoscope was                            introduced through the anus and advanced to the the                            cecum, identified by appendiceal  orifice and                            ileocecal valve. The ileocecal valve, appendiceal                            orifice, and rectum were photographed. The quality                            of the bowel preparation was adequate after                            substantial lavage, suction. The colonoscopy was                            performed without difficulty. The patient tolerated                            the procedure well. Scope In: 8:05:22 AM Scope Out: 8:21:35 AM Scope Withdrawal Time: 0 hours 13 minutes 14 seconds  Total Procedure Duration: 0 hours 16 minutes 13 seconds  Findings:                 The perianal and digital rectal examinations were                            normal.                           Internal hemorrhoids were found during                            retroflexion. The hemorrhoids were moderate and                            Grade I (internal hemorrhoids that do not prolapse).                           The exam was otherwise without abnormality on  direct and retroflexion views. Complications:            No immediate complications. Estimated blood loss:                            None. Estimated Blood Loss:     Estimated blood loss: none. Impression:               - Internal hemorrhoids.                           - The examination was otherwise normal on direct                            and retroflexion views.                           - No specimens collected. Recommendation:           - Repeat colonoscopy in 10 years for screening                            purposes.                           - Patient has a contact number available for                            emergencies. The signs and symptoms of potential                            delayed complications were discussed with the                            patient. Return to normal activities tomorrow.                            Written discharge instructions were  provided to the                            patient.                           - Resume previous diet.                           - Continue present medications. Meryl Dare, MD 04/15/2022 8:24:46 AM This report has been signed electronically.

## 2022-04-15 NOTE — Progress Notes (Signed)
History & Physical  Primary Care Physician:  Myrlene Broker, MD Primary Gastroenterologist: Claudette Head, MD  CHIEF COMPLAINT: Positive Cologuard   HPI: Elizabeth Alvarado is a 56 y.o. female with a positive Cologuard for colonoscopy.   Past Medical History:  Diagnosis Date   Blood transfusion without reported diagnosis 1984   Diabetes mellitus without complication ([facility])    DILATION AND CURETTAGE, HX OF 08/25/2007   GERD (gastroesophageal reflux disease)    Hyperlipidemia    Hypertension    RENAL CALCULUS, HX OF 08/25/2007   Sleep apnea    Ureteral stone 01/09/2011   UTI'S, HX OF 08/25/2007    Past Surgical History:  Procedure Laterality Date   BACK SURGERY     BREAST BIOPSY Left    CERVIX LESION DESTRUCTION  April '12   minor abnormal PAP; "hot ballon application"   DILATION AND CURETTAGE OF UTERUS     hx of   LITHOTRIPSY      Prior to Admission medications   Medication Sig Start Date End Date Taking? Authorizing Provider  irbesartan (AVAPRO) 150 MG tablet TAKE 1 TABLET DAILY 10/17/21  Yes Myrlene Broker, MD  metFORMIN (GLUCOPHAGE-XR) 500 MG 24 hr tablet Take 2 tablets (1,000 mg total) by mouth daily. 12/25/21  Yes Romero Belling, MD  Multiple Vitamin (MULTI VITAMIN DAILY PO) Take by mouth.   Yes [provider]  Semaglutide (RYBELSUS) 3 MG TABS Take 3 mg by mouth daily. 12/25/21  Yes Romero Belling, MD  Calcium Carbonate-Vit D-Min (CALCIUM 1200 PO) Take by mouth.    [provider]  pantoprazole (PROTONIX) 40 MG tablet TAKE 1 TABLET DAILY 10/17/21   Myrlene Broker, MD  simvastatin (ZOCOR) 20 MG tablet TAKE 1 TABLET AT BEDTIME 10/17/21   Myrlene Broker, MD    Current Outpatient Medications  Medication Sig Dispense Refill   irbesartan (AVAPRO) 150 MG tablet TAKE 1 TABLET DAILY 90 tablet 3   metFORMIN (GLUCOPHAGE-XR) 500 MG 24 hr tablet Take 2 tablets (1,000 mg total) by mouth daily. 180 tablet 1   Multiple Vitamin (MULTI  VITAMIN DAILY PO) Take by mouth.     Semaglutide (RYBELSUS) 3 MG TABS Take 3 mg by mouth daily. 30 tablet 11   Calcium Carbonate-Vit D-Min (CALCIUM 1200 PO) Take by mouth.     pantoprazole (PROTONIX) 40 MG tablet TAKE 1 TABLET DAILY 90 tablet 3   simvastatin (ZOCOR) 20 MG tablet TAKE 1 TABLET AT BEDTIME 90 tablet 3   Current Facility-Administered Medications  Medication Dose Route Frequency Provider Last Rate Last Admin   0.9 %  sodium chloride infusion  500 mL Intravenous Once Meryl Dare, MD        Allergies as of 04/15/2022   (No Known Allergies)    Family History  Problem Relation Age of Onset   Colon polyps Mother    Cancer Mother        breast cancer, 1943   Hypertension Mother    Colon polyps Father    Cancer Father        prostate cancer, 1942   Hyperlipidemia Father    Diabetes Neg Hx    Colon cancer Neg Hx    Esophageal cancer Neg Hx    Stomach cancer Neg Hx    Rectal cancer Neg Hx     Social History   Socioeconomic History   Marital status: Married    Spouse name: Not on file   Number of children: 1  Years of education: 58   Highest education level: Not on file  Occupational History   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    Employer: [employer]    Comment: [employer], [employer] ac[employer].  Tobacco Use   Smoking status: Never   Smokeless tobacco: Never  Vaping Use   Vaping Use: Never used  Substance and Sexual Activity   Alcohol use: Yes    Alcohol/week: 2.0 standard drinks of alcohol    Types: 2 Standard drinks or equivalent per week    Comment: rare drink of wine or beer once a month   Drug use: No   Sexual activity: Yes    Partners: Male    Comment: Monogamous relationship with husband.  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 Life is good   Social Determinants of  Corporate investment banker Strain: Not on file  Food Insecurity: Not on file  Transportation Needs: Not on file  Physical Activity: Not on file  Stress: Not on file  Social Connections: Not on file  Intimate Partner Violence: Not on file    Review of Systems:  All systems reviewed were negative except where noted in HPI.   Physical Exam: General:  Alert, well-developed, in NAD Head:  Normocephalic and atraumatic. Eyes:  Sclera clear, no icterus.   Conjunctiva pink. Ears:  Normal auditory acuity. Mouth:  No deformity or lesions.  Neck:  Supple; no masses . Lungs:  Clear throughout to auscultation.   No wheezes, crackles, or rhonchi. No acute distress. Heart:  Regular rate and rhythm; no murmurs. Abdomen:  Soft, nondistended, nontender. No masses, hepatomegaly. No obvious masses.  Normal bowel .    Rectal:  Deferred   Msk:  Symmetrical without gross deformities.. Pulses:  Normal pulses noted. Extremities:  Without edema. Neurologic:  Alert and  oriented x4;  grossly normal neurologically. Skin:  Intact without significant lesions or rashes. Cervical Nodes:  No significant cervical adenopathy. Psych:  Alert and cooperative. Normal mood and affect.   Impression / Plan:   Positive Cologuard for colonoscopy.  Venita Lick. Russella Dar  04/15/2022, 8:00 AM See Loretha Stapler,  GI, to contact our on call provider

## 2022-04-16 ENCOUNTER — Telehealth: Payer: Self-pay | Admitting: *Deleted

## 2022-04-16 NOTE — Telephone Encounter (Signed)
  Follow up Call-     04/15/2022    7:07 AM  Call back number  Post procedure Call Back phone  # [phone]  Permission to leave phone message Yes     Patient questions:  Do you have a fever, pain , or abdominal swelling? No. Pain Score  0 *  Have you tolerated food without any problems? Yes.    Have you been able to return to your normal activities? Yes.    Do you have any questions about your discharge instructions: Diet   No. Medications  No. Follow up visit  No.  Do you have questions or concerns about your Care? No.  Actions: * If pain score is 4 or above: No action needed, pain <4.

## 2022-07-04 ENCOUNTER — Other Ambulatory Visit: Payer: Self-pay

## 2022-07-04 MED ORDER — METFORMIN HCL ER 500 MG PO TB24: 1000.0000 mg | ORAL_TABLET | Freq: Every day | ORAL | 0 refills | Status: DC

## 2022-10-08 ENCOUNTER — Other Ambulatory Visit: Payer: Self-pay | Admitting: Internal Medicine

## 2022-10-09 ENCOUNTER — Telehealth: Payer: Self-pay | Admitting: Internal Medicine

## 2022-10-09 NOTE — Telephone Encounter (Signed)
New message    1. Which medications need to be refilled? (please list name of each medication and dose if known) metFORMIN (GLUCOPHAGE-XR) 500 MG 24 hr tablet   2. Which pharmacy/location (including street and city if local pharmacy) is medication to be sent to?[facility] [place]    3. Do they need a 30 day or 90 day supply? 30 days supply    4. Upcoming appt 12/04/22 with Dr. Renne Crigler

## 2022-10-10 MED ORDER — METFORMIN HCL ER 500 MG PO TB24: 1000.0000 mg | ORAL_TABLET | Freq: Every day | ORAL | 0 refills | Status: DC

## 2022-10-10 NOTE — Telephone Encounter (Signed)
done

## 2022-11-22 ENCOUNTER — Other Ambulatory Visit: Payer: Self-pay | Admitting: Internal Medicine

## 2022-12-04 ENCOUNTER — Encounter: Payer: Self-pay | Admitting: Internal Medicine

## 2022-12-04 ENCOUNTER — Ambulatory Visit (INDEPENDENT_AMBULATORY_CARE_PROVIDER_SITE_OTHER): Payer: Managed Care, Other (non HMO) | Admitting: Internal Medicine

## 2022-12-04 VITALS — BP 136/80 | HR 80 | Ht 68.0 in | Wt 192.0 lb

## 2022-12-04 DIAGNOSIS — E1169 Type 2 diabetes mellitus with other specified complication: Secondary | ICD-10-CM

## 2022-12-04 LAB — POCT GLYCOSYLATED HEMOGLOBIN (HGB A1C): Hemoglobin A1C: 6.9 % — AB (ref 4.0–5.6)

## 2022-12-04 MED ORDER — RYBELSUS 7 MG PO TABS: 7.0000 mg | ORAL_TABLET | Freq: Every day | ORAL | 3 refills | Status: DC

## 2022-12-04 MED ORDER — METFORMIN HCL ER 500 MG PO TB24: 500.0000 mg | ORAL_TABLET | Freq: Two times a day (BID) | ORAL | 3 refills | Status: DC

## 2022-12-04 NOTE — Progress Notes (Signed)
Patient ID: Elizabeth Alvarado, female   DOB: 26-Oct-1965, 57 y.o.   MRN: [id number]  HPI: Elizabeth Alvarado is a 57 y.o.-year-old female, returning for follow-up for DM2, dx in 2018, non-insulin-dependent, controlled, without long-term complications. Pt. previously saw Dr. Loanne Drilling, last visit almost 1 year ago.  Interim history (since last visit with Dr. Loanne Drilling): No increased urination, blurry vision, nausea, chest pain. She mentions that she did relax her diet recently.  Reviewed HbA1c: Lab Results  Component Value Date   HGBA1C 6.1 (A) 12/25/2021   HGBA1C 7.0 (H) 10/03/2021   HGBA1C 6.9 (A) 03/01/2021   HGBA1C 6.5 08/31/2020   HGBA1C 6.0 (A) 04/25/2020   HGBA1C 6.0 07/29/2019   HGBA1C 5.8 (A) 02/03/2019   HGBA1C 5.6 06/30/2018   HGBA1C 6.9 (A) 04/02/2018   HGBA1C 7.0 (H) 02/07/2017   Pt is on a regimen of: - Metformin ER 500 mg 2x a day, with meals She was on Janumet before but developed vaginitis so this was stopped at last visit with Dr. Loanne Drilling in 12/2021. She did not start Rybelsus.  Pt checks her sugars 1x a week and they are: - am: 99-115, 120 - 2h after b'fast: n/c - before lunch: n/c - 2h after lunch: n/c - before dinner: n/c - 2h after dinner: n/c - bedtime: 100-115 - nighttime: n/c Lowest sugar was 99; ? hypoglycemia awareness.  Highest sugar was 120.  Glucometer: AccuChek  Pt's meals are: - Breakfast: pack or crackers or hard boiled eggs - Lunch: salad or steak, shrimp - Dinner: occas. Fast food - Snacks: pack of crackers She recently relaxed her diet; drinks diet drinks - 1x a day  - no CKD, last BUN/creatinine:  Lab Results  Component Value Date   BUN 14 10/03/2021   BUN 17 03/05/2021   CREATININE 0.68 10/03/2021   CREATININE 0.63 03/05/2021  Prev. on Avapro 150 mg daily - stopped.  -+ HL; last set of lipids: Lab Results  Component Value Date   CHOL 133 10/03/2021   HDL 39.90 10/03/2021   LDLCALC 67 10/03/2021   LDLDIRECT 94.0  03/11/2011   TRIG 127.0 10/03/2021   CHOLHDL 3 10/03/2021  Prev. on Zocor 20 mg daily - stopped.  - last eye exam was 2024. No DR reportedly.   - no numbness and tingling in her feet.  Last foot exam 10/12/2021.  History of OSA, kidney stones, UTIs, GERD.  She had a positive colorectal cancer screening. She declined gastric bypass in the past per review of Dr. Cordelia Pen note.  ROS: + see HPI  Past Medical History:  Diagnosis Date   Blood transfusion without reported diagnosis 1984   Diabetes mellitus without complication ([place])    DILATION AND CURETTAGE, HX OF 08/25/2007   GERD (gastroesophageal reflux disease)    Hyperlipidemia    Hypertension    RENAL CALCULUS, HX OF 08/25/2007   Sleep apnea    Ureteral stone 01/09/2011   UTI'S, HX OF 08/25/2007   Past Surgical History:  Procedure Laterality Date   BACK SURGERY     BREAST BIOPSY Left    CERVIX LESION DESTRUCTION  April '12   minor abnormal PAP; "hot ballon application"   DILATION AND CURETTAGE OF UTERUS     hx of   LITHOTRIPSY     Social History   Socioeconomic History   Marital status: Married    Spouse name: Not on file   Number of children: 1   Years of education: 14   Highest education  level: Not on file  Occupational History   Occupation: [employer], [employer].    Employer: [employer]    Comment: [employer], [place] [employer].  Tobacco Use   Smoking status: Never   Smokeless tobacco: Never  Vaping Use   Vaping Use: Never used  Substance and Sexual Activity   Alcohol use: Yes    Alcohol/week: 2.0 standard drinks of alcohol    Types: 2 Standard drinks or equivalent per week    Comment: rare drink of wine or beer once a month   Drug use: No   Sexual activity: Yes    Partners: Male    Comment: Monogamous relationship with husband.  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 Married '95. 1 son '01.Marriage is good.     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 Life is good   Scientist, physiological Strain: Not on file  Food Insecurity: Not on file  Transportation Needs: Not on file  Physical Activity: Not on file  Stress: Not on file  Social Connections: Not on file  Intimate Partner Violence: Not on file   Current Outpatient Medications on File Prior to Visit  Medication Sig Dispense Refill   Calcium Carbonate-Vit D-Min (CALCIUM 1200 PO) Take by mouth.     irbesartan (AVAPRO) 150 MG tablet TAKE 1 TABLET DAILY 90 tablet 3   metFORMIN (GLUCOPHAGE-XR) 500 MG 24 hr tablet Take 2 tablets (1,000 mg total) by mouth daily. 120 tablet 0   Multiple Vitamin (MULTI VITAMIN DAILY PO) Take by mouth.     pantoprazole (PROTONIX) 40 MG tablet TAKE 1 TABLET DAILY 90 tablet 3   Semaglutide (RYBELSUS) 3 MG TABS Take 3 mg by mouth daily. 30 tablet 11   simvastatin (ZOCOR) 20 MG tablet TAKE 1 TABLET AT BEDTIME 90 tablet 3   No current facility-administered medications on file prior to visit.   No Known Allergies Family History  Problem Relation Age of Onset   Colon polyps Mother    Cancer Mother        breast cancer, 1943   Hypertension Mother    Colon polyps Father    Cancer Father        prostate cancer, 1942   Hyperlipidemia Father    Diabetes Neg Hx    Colon cancer Neg Hx    Esophageal cancer Neg Hx    Stomach cancer Neg Hx    Rectal cancer Neg Hx    PE: BP 136/80 (BP Location: Left Arm, Patient Position: Sitting, Cuff Size: Large)   Pulse 80   Ht 5\' 8"  (1.727 m)   Wt 192 lb (87.1 kg)   LMP 10/14/2017   SpO2 99%   BMI 29.19 kg/m  Wt Readings from Last 3 Encounters:  12/04/22 192 lb (87.1 kg)  04/15/22 185 lb (83.9 kg)  03/28/22 185 lb (83.9 kg)   Constitutional: overweight, in NAD Eyes:  EOMI, no exophthalmos ENT: no neck masses, no cervical lymphadenopathy Cardiovascular: RRR, No MRG Respiratory: CTA B Musculoskeletal: no deformities Skin:no  rashes Neurological: no tremor with outstretched hands Diabetic Foot Exam - Simple   Simple Foot Form Diabetic Foot exam was performed with the following findings: Yes 12/04/2022 10:45 AM  Visual Inspection No deformities, no ulcerations, no other skin breakdown bilaterally: Yes Sensation Testing Intact to touch and monofilament testing bilaterally: Yes Pulse Check Posterior Tibialis and Dorsalis  pulse intact bilaterally: Yes Comments    ASSESSMENT: 1. DM2, non-insulin-dependent, controlled, without long-term complications  2. HL  PLAN:  1. Patient with long-standing, uncontrolled diabetes, on oral antidiabetic regimen with metformin ER only, with good control.  At today's visit, HbA1c is 6.9% (higher).  Upon questioning, she did not continue with Rybelsus as prescribed by Dr. Loanne Drilling at last visit.  She mentions that she did fill the prescription but did not start.  She was worried about thyroid cancer.  I advised her that Rybelsus would only be contraindicated in patients that have a personal or family history of medullary thyroid cancer or are at risk for this.  We also discussed about the other benefits of the medication to include cardiovascular risk reduction and weight loss.  She agrees to try Rybelsus.  Will start at a low dose and increase as tolerated. -At today's visit, her blood sugars appear to be at goal at home, but I suspect that we are missing higher blood sugars after certain snacks and meals.  We discussed about the need to improve her diet, but I also advised her to check at different times of the day, more frequently than once a week. - I suggested to:  Patient Instructions  Please continue: - Metformin ER 500 mg 2x a day, with meals  Restart: - Rybelsus 3 mg before breakfast and increase to 7 mg in 1-2 weeks, as tolerated  Please return in 4 months.   - check sugars at different times of the day - check 1x a day, rotating checks - discussed about CBG targets for  treatment: 80-130 mg/dL before meals and <180 mg/dL after meals; target HbA1c <7%. - given sugar log and advised how to fill it and to bring it at next appt  - given foot care handout  - given instructions for hypoglycemia management "15-15 rule"  - advised for yearly eye exams  - Return to clinic in 4 months  2. HL - Reviewed latest lipid panel from 09/2021: Fractions at goal: Lab Results  Component Value Date   CHOL 133 10/03/2021   HDL 39.90 10/03/2021   LDLCALC 67 10/03/2021   LDLDIRECT 94.0 03/11/2011   TRIG 127.0 10/03/2021   CHOLHDL 3 10/03/2021  -Previously on Zocor 20 mg daily but stopped as she wanted to manage this by diet.  However, she lately relaxed her diet.  We discussed about ways to improve this. - she is due for another lipid panel but she plans to schedule an appointment with PCP.  - Total time spent for the visit: 40 min, in precharting, postcharting, reviewing Dr. Cordelia Pen last note, obtaining medical information from the chart and from the pt, reviewing her  previous labs, evaluations, and treatments, reviewing her symptoms, counseling her about her diabetes (please see the discussed topics above), and developing a plan to further treat it; she had a number of questions which I addressed.  Philemon Kingdom, MD PhD [facility] Endocrinology

## 2022-12-04 NOTE — Patient Instructions (Addendum)
Please continue: - Metformin ER 500 mg 2x a day, with meals  Restart: - Rybelsus 3 mg before breakfast and increase to 7 mg in 1-2 weeks, as tolerated  Please return in 4 months.   PATIENT INSTRUCTIONS FOR TYPE 2 DIABETES:  DIET AND EXERCISE Diet and exercise is an important part of diabetic treatment.  We recommended aerobic exercise in the form of brisk walking (working between 40-60% of maximal aerobic capacity, similar to brisk walking) for 150 minutes per week (such as 30 minutes five days per week) along with 3 times per week performing 'resistance' training (using various gauge rubber tubes with handles) 5-10 exercises involving the major muscle groups (upper body, lower body and core) performing 10-15 repetitions (or near fatigue) each exercise. Start at half the above goal but build slowly to reach the above goals. If limited by weight, joint pain, or disability, we recommend daily walking in a swimming pool with water up to waist to reduce pressure from joints while allow for adequate exercise.    BLOOD GLUCOSES Monitoring your blood glucoses is important for continued management of your diabetes. Please check your blood glucoses 2-4 times a day: fasting, before meals and at bedtime (you can rotate these measurements - e.g. one day check before the 3 meals, the next day check before 2 of the meals and before bedtime, etc.).   HYPOGLYCEMIA (low blood sugar) Hypoglycemia is usually a reaction to not eating, exercising, or taking too much insulin/ other diabetes drugs.  Symptoms include tremors, sweating, hunger, confusion, headache, etc. Treat IMMEDIATELY with 15 grams of Carbs: 4 glucose tablets  cup regular juice/soda 2 tablespoons raisins 4 teaspoons sugar 1 tablespoon honey Recheck blood glucose in 15 mins and repeat above if still symptomatic/blood glucose <100.  RECOMMENDATIONS TO REDUCE YOUR RISK OF DIABETIC COMPLICATIONS: * Take your prescribed MEDICATION(S) * Follow a  DIABETIC diet: Complex carbs, fiber rich foods, (monounsaturated and polyunsaturated) fats * AVOID saturated/trans fats, high fat foods, >2,300 mg salt per day. * EXERCISE at least 5 times a week for 30 minutes or preferably daily.  * DO NOT SMOKE OR DRINK more than 1 drink a day. * Check your FEET every day. Do not wear tightfitting shoes. Contact us if you develop an ulcer * See your EYE doctor once a year or more if needed * Get a FLU shot once a year * Get a PNEUMONIA vaccine once before and once after age 1 years  GOALS:  * Your Hemoglobin A1c of <7%  * fasting sugars need to be 80-130 * after meals sugars need to be <180 (2h after you start eating) * Your Systolic BP should be [id number] or lower  * Your Diastolic BP should be 80 or lower  * Your HDL (Good Cholesterol) should be 40 or higher  * Your LDL (Bad Cholesterol) should be ideally <70. * Your Triglycerides should be 150 or lower  * Your Urine microalbumin (kidney function) should be <30 * Your Body Mass Index should be 25 or lower   Please consider the following ways to cut down carbs and fat and increase fiber and micronutrients in your diet: - substitute whole grain for white bread or pasta - substitute brown rice for white rice - substitute 90-calorie flat bread pieces for slices of bread when possible - substitute sweet potatoes or yams for white potatoes - substitute humus for margarine - substitute tofu for cheese when possible - substitute almond or rice milk for regular milk (would  not drink soy milk daily due to concern for soy estrogen influence on breast cancer risk) - substitute dark chocolate for other sweets when possible - substitute water - can add lemon or orange slices for taste - for diet sodas (artificial sweeteners will trick your body that you can eat sweets without getting calories and will lead you to overeating and weight gain in the long run) - do not skip breakfast or other meals (this will slow down  the metabolism and will result in more weight gain over time)  - can try smoothies made from fruit and almond/rice milk in am instead of regular breakfast - can also try old-fashioned (not instant) oatmeal made with almond/rice milk in am - order the dressing on the side when eating salad at a restaurant (pour less than half of the dressing on the salad) - eat as little meat as possible - can try juicing, but should not forget that juicing will get rid of the fiber, so would alternate with eating raw veg./fruits or drinking smoothies - use as little oil as possible, even when using olive oil - can dress a salad with a mix of balsamic vinegar and lemon juice, for e.g. - use agave nectar, stevia sugar, or regular sugar rather than artificial sweateners - steam or broil/roast veggies  - snack on veggies/fruit/nuts (unsalted, preferably) when possible, rather than processed foods - reduce or eliminate aspartame in diet (it is in diet sodas, chewing gum, etc) Read the labels!  Try to read Dr. Janene Harvey book: "Program for Reversing Diabetes" for other ideas for healthy eating.

## 2022-12-05 ENCOUNTER — Telehealth: Payer: Self-pay | Admitting: Internal Medicine

## 2022-12-05 NOTE — Telephone Encounter (Signed)
Prescription Request  12/05/2022  LOV: Visit date not found  What is the name of the medication or equipment?  irbesartan (AVAPRO) 150 MG tablet  Have you contacted your pharmacy to request a refill? Yes   Which pharmacy would you like this sent to?   [facility] [place], [address] [place] [address] Phone: [phone] Fax: [phone]    Patient notified that their request is being sent to the clinical staff for review and that they should receive a response within 2 business days.   Please advise at Mobile [phone] (mobile)

## 2022-12-09 NOTE — Telephone Encounter (Signed)
This has been charted in the patient medication list she is not taking.

## 2022-12-10 ENCOUNTER — Other Ambulatory Visit (HOSPITAL_COMMUNITY): Payer: Self-pay

## 2022-12-10 ENCOUNTER — Telehealth: Payer: Self-pay

## 2022-12-10 DIAGNOSIS — E1169 Type 2 diabetes mellitus with other specified complication: Secondary | ICD-10-CM

## 2022-12-10 NOTE — Telephone Encounter (Signed)
Patient Advocate Encounter   Received notification from [place] that prior authorization is required for Rybelsus 7MG  tablets   Submitted: 12-10-2022 Key [id number]  Status is pending

## 2022-12-18 NOTE — Telephone Encounter (Signed)
Patient Advocate Encounter  Received a fax from [employer] regarding Prior Authorization for Rybelsus 7MG  tablets.   Authorization has been DENIED due to     Determination letter attached to patient chart

## 2022-12-19 NOTE — Telephone Encounter (Signed)
Hm, maybe we can resubmit advising him that she has type 2 diabetes, is taking metformin and sulfonylurea would not be indicated due to history of overweight.  Only on metformin, her HbA1c increased to 6.9% recently.

## 2022-12-20 NOTE — Telephone Encounter (Signed)
This has been sent to a clinical pharmacist for review.  Please be advised appeals may take up to 5 business days to be submitted as pharmacist prepares necessary documentation. Thank you!

## 2022-12-24 MED ORDER — METFORMIN HCL ER 500 MG PO TB24: 500.0000 mg | ORAL_TABLET | Freq: Three times a day (TID) | ORAL | 3 refills | Status: DC

## 2022-12-24 NOTE — Telephone Encounter (Signed)
We can try to call the patient to see if she remembers taking the higher dose of metformin.  If not, we can try to increase this first to 1500 mg daily and then may be able to add Rybelsus afterwards.

## 2022-12-24 NOTE — Addendum Note (Signed)
Addended by: Kenyon Ana on: 12/24/2022 05:14 PM   Modules accepted: Orders

## 2022-12-24 NOTE — Telephone Encounter (Signed)
Per Rosann Auerbach, patient must fail metformin 1500 mg daily in order to add on therapy or be greater than 1.5% above goal. A1c. I don't see where patient has had metformin 1500 mg daily, has she tried that and not been able to tolerate?

## 2022-12-24 NOTE — Telephone Encounter (Signed)
Pt did not mention taking higher dose. Rx sent with increased dose to pharmacy.

## 2023-01-29 ENCOUNTER — Other Ambulatory Visit: Payer: Self-pay | Admitting: Obstetrics and Gynecology

## 2023-01-29 DIAGNOSIS — Z1231 Encounter for screening mammogram for malignant neoplasm of breast: Secondary | ICD-10-CM

## 2023-03-18 ENCOUNTER — Ambulatory Visit
Admission: RE | Admit: 2023-03-18 | Discharge: 2023-03-18 | Disposition: A | Discharge status: Home / Self Care | Payer: Managed Care, Other (non HMO) | Source: Ambulatory Visit | Attending: Obstetrics and Gynecology | Admitting: Obstetrics and Gynecology

## 2023-03-18 DIAGNOSIS — Z1231 Encounter for screening mammogram for malignant neoplasm of breast: Secondary | ICD-10-CM

## 2023-04-24 ENCOUNTER — Ambulatory Visit: Payer: Managed Care, Other (non HMO) | Admitting: Internal Medicine

## 2023-05-06 LAB — HM DIABETES EYE EXAM

## 2023-05-21 ENCOUNTER — Ambulatory Visit (INDEPENDENT_AMBULATORY_CARE_PROVIDER_SITE_OTHER): Payer: Managed Care, Other (non HMO) | Admitting: Internal Medicine

## 2023-05-21 ENCOUNTER — Encounter: Payer: Self-pay | Admitting: Internal Medicine

## 2023-05-21 ENCOUNTER — Telehealth: Payer: Self-pay

## 2023-05-21 ENCOUNTER — Other Ambulatory Visit (HOSPITAL_COMMUNITY): Payer: Self-pay

## 2023-05-21 VITALS — BP 122/80 | HR 82 | Ht 68.0 in | Wt 197.0 lb

## 2023-05-21 DIAGNOSIS — E1169 Type 2 diabetes mellitus with other specified complication: Secondary | ICD-10-CM | POA: Diagnosis not present

## 2023-05-21 DIAGNOSIS — Z7984 Long term (current) use of oral hypoglycemic drugs: Secondary | ICD-10-CM

## 2023-05-21 LAB — POCT GLYCOSYLATED HEMOGLOBIN (HGB A1C): Hemoglobin A1C: 7.8 % — AB (ref 4.0–5.6)

## 2023-05-21 MED ORDER — SEMAGLUTIDE(0.25 OR 0.5MG/DOS) 2 MG/3ML ~~LOC~~ SOPN: 0.5000 mg | PEN_INJECTOR | SUBCUTANEOUS | 3 refills | Status: DC

## 2023-05-21 NOTE — Patient Instructions (Addendum)
Please continue: - Metformin ER 500 mg 2x a day, with meals  Please start: - Ozempic 0.25 mg weekly in a.m. (for example on Sunday morning) x 2-4 weeks, then increase to 0.5 mg weekly in a.m. if no nausea or hypoglycemia.  Try to check blood sugars at different times of the day.  Please return in 4 months.

## 2023-05-21 NOTE — Telephone Encounter (Signed)
Pharmacy Patient Advocate Encounter   Received notification from Pt Calls Messages that prior authorization for Ozempic is required/requested.   Insurance verification completed.   The patient is insured through [employer] .   Per test claim: The current 30 day co-pay is, $24.99.  No PA needed at this time. This test claim was processed through [facility]- copay amounts may vary at other pharmacies due to pharmacy/plan contracts, or as the patient moves through the different stages of their insurance plan.

## 2023-05-21 NOTE — Progress Notes (Signed)
Patient ID: Elizabeth Alvarado, female   DOB: 11-Sep-1965, 57 y.o.   MRN: [id number]  HPI: Elizabeth Alvarado is a 57 y.o.-year-old female, returning for follow-up for DM2, dx in 2018, non-insulin-dependent, controlled, without long-term complications. Pt. previously saw Dr. Everardo All, last visit 6 months ago.  Interim history: No increased urination, blurry vision, nausea, chest pain.  Reviewed HbA1c: Lab Results  Component Value Date   HGBA1C 6.9 (A) 12/04/2022   HGBA1C 6.1 (A) 12/25/2021   HGBA1C 7.0 (H) 10/03/2021   HGBA1C 6.9 (A) 03/01/2021   HGBA1C 6.5 08/31/2020   HGBA1C 6.0 (A) 04/25/2020   HGBA1C 6.0 07/29/2019   HGBA1C 5.8 (A) 02/03/2019   HGBA1C 5.6 06/30/2018   HGBA1C 6.9 (A) 04/02/2018   Pt is on a regimen of: - Metformin ER 500 mg 2x a day, with meals We tried Rybelsus 3 >> 7 mg before b'fast - rec'd 11/2022 - was not able to start 2/2 lack of insurance coverage. She was on Janumet before but developed vaginitis so this was stopped at last visit with Dr. Everardo All in 12/2021.   Pt checks her sugars 1x a day and they are: - am: 99-115, 120 >> 110-120 - 2h after b'fast: n/c - before lunch: n/c - 2h after lunch: n/c - before dinner: n/c - 2h after dinner: n/c - bedtime: 100-115 >> 120-130, 150s - nighttime: n/c Lowest sugar was 99 >> 110; ? hypoglycemia awareness.  Highest sugar was 120 >> 150s.  Glucometer: AccuChek  Pt's meals are: - Breakfast: pack or crackers or hard boiled eggs - Lunch: salad or steak, shrimp - Dinner: occas. Fast food - Snacks: pack of crackers  - no CKD, last BUN/creatinine:  Lab Results  Component Value Date   BUN 14 10/03/2021   BUN 17 03/05/2021   CREATININE 0.68 10/03/2021   CREATININE 0.63 03/05/2021   Lab Results  Component Value Date   MICRALBCREAT 1.0 10/03/2021   MICRALBCREAT 3.0 08/31/2020   MICRALBCREAT 1.0 07/29/2019   MICRALBCREAT 0.9 06/10/2018  Prev. on Avapro 150 mg daily - stopped.  -+ HL; last set  of lipids: Lab Results  Component Value Date   CHOL 133 10/03/2021   HDL 39.90 10/03/2021   LDLCALC 67 10/03/2021   LDLDIRECT 94.0 03/11/2011   TRIG 127.0 10/03/2021   CHOLHDL 3 10/03/2021  Prev. on Zocor 20 mg daily -came off to try to work on her diet.  - last eye exam was 2024. No DR reportedly.   - no numbness and tingling in her feet.  Last foot exam 12/04/2022.  History of OSA, kidney stones, UTIs, GERD.  She had a positive colorectal cancer screening. She declined gastric bypass in the past per review of Dr. George Hugh note.  ROS: + see HPI  Past Medical History:  Diagnosis Date   Blood transfusion without reported diagnosis 1984   Diabetes mellitus without complication (HCC)    DILATION AND CURETTAGE, HX OF 08/25/2007   GERD (gastroesophageal reflux disease)    Hyperlipidemia    Hypertension    RENAL CALCULUS, HX OF 08/25/2007   Sleep apnea    Ureteral stone 01/09/2011   UTI'S, HX OF 08/25/2007   Past Surgical History:  Procedure Laterality Date   BACK SURGERY     BREAST BIOPSY Left    CERVIX LESION DESTRUCTION  April '12   minor abnormal PAP; "hot ballon application"   DILATION AND CURETTAGE OF UTERUS     hx of   LITHOTRIPSY  Social History   Socioeconomic History   Marital status: Married    Spouse name: Not on file   Number of children: 1   Years of education: 14   Highest education level: Not on file  Occupational History   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    Employer: [employer]    Comment: [employer], [place] [employer] acctg.  Tobacco Use   Smoking status: Never   Smokeless tobacco: Never  Vaping Use   Vaping status: Never Used  Substance and Sexual Activity   Alcohol use: Yes    Alcohol/week: 2.0 standard drinks of alcohol    Types: 2 Standard drinks or equivalent per week    Comment: rare drink of wine or beer once a month   Drug use: No   Sexual activity: Yes    Partners: Male    Comment: Monogamous relationship with  husband.  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 Life is good   Chemical engineer Strain: Not on file  Food Insecurity: Not on file  Transportation Needs: Not on file  Physical Activity: Not on file  Stress: Not on file  Social Connections: Not on file  Intimate Partner Violence: Not on file   Current Outpatient Medications on File Prior to Visit  Medication Sig Dispense Refill   Calcium Carbonate-Vit D-Min (CALCIUM 1200 PO) Take by mouth.     irbesartan (AVAPRO) 150 MG tablet TAKE 1 TABLET DAILY (Patient not taking: Reported on 12/04/2022) 90 tablet 3   metFORMIN (GLUCOPHAGE-XR) 500 MG 24 hr tablet Take 1 tablet (500 mg total) by mouth with breakfast, with lunch, and with evening meal. 270 tablet 3   Multiple Vitamin (MULTI VITAMIN DAILY PO) Take by mouth.     pantoprazole (PROTONIX) 40 MG tablet TAKE 1 TABLET DAILY (Patient not taking: Reported on 12/04/2022) 90 tablet 3   Semaglutide (RYBELSUS) 7 MG TABS Take 1 tablet (7 mg total) by mouth daily. 90 tablet 3   simvastatin (ZOCOR) 20 MG tablet TAKE 1 TABLET AT BEDTIME (Patient not taking: Reported on 12/04/2022) 90 tablet 3   No current facility-administered medications on file prior to visit.   No Known Allergies Family History  Problem Relation Age of Onset   Colon polyps Mother    Cancer Mother        breast cancer, 1943   Hypertension Mother    Colon polyps Father    Cancer Father        prostate cancer, 1942   Hyperlipidemia Father    Diabetes Neg Hx    Colon cancer Neg Hx    Esophageal cancer Neg Hx    Stomach cancer Neg Hx    Rectal cancer Neg Hx    PE: BP 122/80   Pulse 82   Ht 5\' 8"  (1.727 m)   Wt 197 lb (89.4 kg)   LMP 10/14/2017   SpO2 99%   BMI 29.95 kg/m  Wt Readings from Last 3 Encounters:  05/21/23 197 lb  (89.4 kg)  12/04/22 192 lb (87.1 kg)  04/15/22 185 lb (83.9 kg)   Constitutional: overweight, in NAD Eyes:  EOMI, no exophthalmos ENT: no neck masses, no cervical lymphadenopathy Cardiovascular: RRR, No MRG Respiratory: CTA B Musculoskeletal: no deformities Skin:no rashes Neurological: no tremor with outstretched  hands  ASSESSMENT: 1. DM2, non-insulin-dependent, controlled, without long-term complications  2. HL  3. Overweight  PLAN:  1. Patient with longstanding, uncontrolled, type 2 diabetes, on oral antidiabetic regimen with metformin to which I suggested to add a p.o. GLP-1 receptor agonist at last visit.  At that time, HbA1c was higher, at 6.9%, as did not start Rybelsus as suggested by Dr. Everardo All previously, due to concerns about thyroid cancer.  Sugars were at goal at home but I suspected that we were missing higher blood sugars after certain snacks and meals.  We discussed about improving diet and checking sugars more frequently as she was only checking once a week.  At that time, we did discuss about benefits and possible side effects as Rybelsus and encouraged her to restart it.  I recommended to start at a low dose and increase as tolerated.  However, unfortunately, her insurance declined coverage for this. -At today's visit, sugars at home do not appear to be particularly high, but HbA1c is higher, at 7.8%.  We discussed about changing her test strips to see if the current ones are inaccurate, and we also discussed about checking at different times of the day, especially after meals.  Based on the increase in A1c, my recommendation would be to start an injectable  GLP-1 receptor agonist at a low dose, and increase as needed and as tolerated.  This will also help with weight.  She agrees to try Ozempic.  I showed her the pen and demonstrated correct injection techniques.  Will continue the metformin otherwise. - I suggested to:  Patient Instructions  Please continue: - Metformin  ER 500 mg 2x a day, with meals  Please start: - Ozempic 0.25 mg weekly in a.m. (for example on Sunday morning) x 2-4 weeks, then increase to 0.5 mg weekly in a.m. if no nausea or hypoglycemia.  Try to check blood sugars at different times of the day.  Please return in 4 months.   - we checked her HbA1c: 7.8% (higher) - advised to check sugars at different times of the day - 1x a day, rotating check times - advised for yearly eye exams >> she is UTD - she is due for annual labs -usually has these checks at APE with PCP - return to clinic in 4 months  2. HL -Reviewed latest lipid panel from 09/2021: Fractions at goal: Lab Results  Component Value Date   CHOL 133 10/03/2021   HDL 39.90 10/03/2021   LDLCALC 67 10/03/2021   LDLDIRECT 94.0 03/11/2011   TRIG 127.0 10/03/2021   CHOLHDL 3 10/03/2021  -Previously on Zocor 20 mg daily but stopped as she wanted to manage this by diet.  However, before last visit she relaxed her diet.  We discussed about ways to improve it. -She is due for another lipid panel, but would like to wait until next APE with PCP to have this checked - she plans to call and schedule this  3.  Overweight -With truncal distribution -She would greatly benefit from addition of a GLP-1 receptor agonist.  I recommended to start Ozempic and titrated up.  Discussed about benefits and possible side effects. -she does not have a history of pancreatitis or family history of medullary thyroid cancer   Carlus Pavlov, MD PhD [facility] Endocrinology

## 2023-05-21 NOTE — Telephone Encounter (Signed)
Patient needs PA for Ozempic.

## 2023-05-22 NOTE — Telephone Encounter (Signed)
Please proceed with PA through [facility]

## 2023-05-27 ENCOUNTER — Other Ambulatory Visit (HOSPITAL_COMMUNITY): Payer: Self-pay

## 2023-07-15 ENCOUNTER — Encounter: Payer: Managed Care, Other (non HMO) | Admitting: Internal Medicine

## 2023-07-16 ENCOUNTER — Encounter: Payer: Managed Care, Other (non HMO) | Admitting: Internal Medicine

## 2023-07-31 ENCOUNTER — Other Ambulatory Visit: Payer: Self-pay | Admitting: Internal Medicine

## 2023-08-26 ENCOUNTER — Encounter: Payer: Self-pay | Admitting: Internal Medicine

## 2023-08-26 NOTE — Telephone Encounter (Signed)
 Care team updated and letter sent for eye exam notes.

## 2023-09-24 ENCOUNTER — Encounter: Payer: Self-pay | Admitting: Internal Medicine

## 2023-09-24 ENCOUNTER — Ambulatory Visit: Payer: Managed Care, Other (non HMO) | Admitting: Internal Medicine

## 2023-09-24 VITALS — BP 130/70 | HR 84 | Ht 68.0 in | Wt 192.0 lb

## 2023-09-24 DIAGNOSIS — Z7984 Long term (current) use of oral hypoglycemic drugs: Secondary | ICD-10-CM | POA: Diagnosis not present

## 2023-09-24 DIAGNOSIS — E7849 Other hyperlipidemia: Secondary | ICD-10-CM | POA: Diagnosis not present

## 2023-09-24 DIAGNOSIS — E1169 Type 2 diabetes mellitus with other specified complication: Secondary | ICD-10-CM | POA: Diagnosis not present

## 2023-09-24 DIAGNOSIS — Z7985 Long-term (current) use of injectable non-insulin antidiabetic drugs: Secondary | ICD-10-CM

## 2023-09-24 DIAGNOSIS — E663 Overweight: Secondary | ICD-10-CM

## 2023-09-24 LAB — POCT GLYCOSYLATED HEMOGLOBIN (HGB A1C): Hemoglobin A1C: 6.1 % — AB (ref 4.0–5.6)

## 2023-09-24 MED ORDER — METFORMIN HCL ER 500 MG PO TB24: 500.0000 mg | ORAL_TABLET | Freq: Two times a day (BID) | ORAL | 4 refills | Status: DC

## 2023-09-24 NOTE — Patient Instructions (Addendum)
 Please continue: - Metformin  ER 500 mg 2x a day, with meals - Ozempic  0.5 mg weekly in a.m.  Please return in 4 months.

## 2023-09-24 NOTE — Addendum Note (Signed)
 Addended by: Vernon Goodpasture on: 09/24/2023 12:13 PM   Modules accepted: Orders

## 2023-09-24 NOTE — Progress Notes (Addendum)
Patient ID: Elizabeth Alvarado, female   DOB: 1966/04/23, 58 y.o.   MRN: [id number]  HPI: Elizabeth Alvarado is a 58 y.o.-year-old female, returning for follow-up for DM2, dx in 2018, non-insulin-dependent, controlled, without long-term complications. Pt. previously saw Dr. Everardo All, but last visit with me 4 months ago.  Interim history: No increased urination, blurry vision, nausea, chest pain. She was able to start Ozempic since last visit - no side effects.  Reviewed HbA1c: Lab Results  Component Value Date   HGBA1C 7.8 (A) 05/21/2023   HGBA1C 6.9 (A) 12/04/2022   HGBA1C 6.1 (A) 12/25/2021   HGBA1C 7.0 (H) 10/03/2021   HGBA1C 6.9 (A) 03/01/2021   HGBA1C 6.5 08/31/2020   HGBA1C 6.0 (A) 04/25/2020   HGBA1C 6.0 07/29/2019   HGBA1C 5.8 (A) 02/03/2019   HGBA1C 5.6 06/30/2018   Pt is on a regimen of: - Metformin ER 500 mg 2x a day, with meals - Ozempic 0.25 >> 0.5 mg weekly - started 05/2023 We tried Rybelsus 3 >> 7 mg before b'fast - rec'd 11/2022 - was not able to start 2/2 lack of insurance coverage. She was on Janumet before but developed vaginitis so this was stopped at last visit with Dr. Everardo All in 12/2021.   Pt checks her sugars 1x a day and they are: - am: 99-115, 120 >> 110-120 >> 100-110 - 2h after b'fast: n/c - before lunch: n/c >> 110 - 2h after lunch: n/c >>  up to 120 - before dinner: n/c >> 110 - 2h after dinner: n/c - bedtime: 100-115 >> 120-130, 150s >> 99 - nighttime: n/c Lowest sugar was 99 >> 110 >> 99; ? hypoglycemia awareness.  Highest sugar was 120 >> 150s >> 120.  Glucometer: AccuChek  Pt's meals are: - Breakfast: pack or crackers or hard boiled eggs - Lunch: salad or steak, shrimp - Dinner: occas. Fast food - Snacks: pack of crackers  - no CKD, last BUN/creatinine:  Lab Results  Component Value Date   BUN 14 10/03/2021   BUN 17 03/05/2021   CREATININE 0.68 10/03/2021   CREATININE 0.63 03/05/2021   Lab Results  Component Value  Date   MICRALBCREAT 1.0 10/03/2021   MICRALBCREAT 3.0 08/31/2020   MICRALBCREAT 1.0 07/29/2019   MICRALBCREAT 0.9 06/10/2018  Prev. on Avapro 150 mg daily - stopped.  -+ HL; last set of lipids: Lab Results  Component Value Date   CHOL 133 10/03/2021   HDL 39.90 10/03/2021   LDLCALC 67 10/03/2021   LDLDIRECT 94.0 03/11/2011   TRIG 127.0 10/03/2021   CHOLHDL 3 10/03/2021  Prev. on Zocor 20 mg daily -came off to try to work on her diet.  - last eye exam was 05/06/2023. No DR.  - no numbness and tingling in her feet.  Last foot exam 12/04/2022.  History of OSA, kidney stones, UTIs, GERD.  She had a positive colorectal cancer screening. She declined gastric bypass in the past per review of Dr. George Hugh note. She had an Es stretching 2023.   ROS: + see HPI  Past Medical History:  Diagnosis Date   Blood transfusion without reported diagnosis 1984   Diabetes mellitus without complication (HCC)    DILATION AND CURETTAGE, HX OF 08/25/2007   GERD (gastroesophageal reflux disease)    Hyperlipidemia    Hypertension    RENAL CALCULUS, HX OF 08/25/2007   Sleep apnea    Ureteral stone 01/09/2011   UTI'S, HX OF 08/25/2007   Past Surgical History:  Procedure Laterality Date  BACK SURGERY     BREAST BIOPSY Left    CERVIX LESION DESTRUCTION  April '12   minor abnormal PAP; "hot ballon application"   DILATION AND CURETTAGE OF UTERUS     hx of   LITHOTRIPSY     Social History   Socioeconomic History   Marital status: Married    Spouse name: Not on file   Number of children: 1   Years of education: 14   Highest education level: Not on file  Occupational History   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    Employer: [employer]    Comment: [employer], [place] [employer] acctg.  Tobacco Use   Smoking status: Never   Smokeless tobacco: Never  Vaping Use   Vaping status: Never Used  Substance and Sexual Activity   Alcohol use: Yes    Alcohol/week: 2.0 standard drinks  of alcohol    Types: 2 Standard drinks or equivalent per week    Comment: rare drink of wine or beer once a month   Drug use: No   Sexual activity: Yes    Partners: Male    Comment: Monogamous relationship with husband.  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     irbesartan (AVAPRO) 150 MG tablet TAKE 1 TABLET DAILY 90 tablet 3   metFORMIN (GLUCOPHAGE-XR) 500 MG 24 hr tablet Take 1 tablet (500 mg total) by mouth with breakfast, with lunch, and with evening meal. 270 tablet 3   Multiple Vitamin (MULTI VITAMIN DAILY PO) Take by mouth.     OZEMPIC, 0.25 OR 0.5 MG/DOSE, 2 MG/3ML SOPN INJECT 0.5MG  INTO THE SKIN ONCE A WEEK 9 mL 3   pantoprazole (PROTONIX) 40 MG tablet TAKE 1 TABLET DAILY (Patient not taking: Reported on 05/21/2023) 90 tablet 3   simvastatin (ZOCOR) 20 MG tablet TAKE 1 TABLET AT BEDTIME (Patient not taking: Reported on 05/21/2023) 90 tablet 3   No current facility-administered medications on file prior to visit.   No Known Allergies Family History  Problem Relation Age of Onset   Colon polyps Mother    Cancer Mother        breast cancer, 1943   Hypertension Mother    Colon polyps Father    Cancer Father        prostate cancer, 1942   Hyperlipidemia Father    Diabetes Neg Hx    Colon cancer Neg Hx    Esophageal cancer Neg Hx    Stomach cancer Neg Hx    Rectal cancer Neg Hx     PE: BP 130/70   Pulse 84   Ht 5\' 8"  (1.727 m)   Wt 192 lb (87.1 kg)   LMP 10/14/2017   SpO2 96%   BMI 29.19 kg/m  Wt Readings from Last 3 Encounters:  09/24/23 192 lb (87.1 kg)  05/21/23 197 lb (89.4  kg)  12/04/22 192 lb (87.1 kg)   Constitutional: overweight, in NAD Eyes:  EOMI, no exophthalmos ENT: no neck masses, no cervical lymphadenopathy Cardiovascular: RRR, No MRG Respiratory: CTA B Musculoskeletal: no deformities Skin:no rashes Neurological: no tremor with outstretched hands  ASSESSMENT: 1. DM2, non-insulin-dependent, controlled, without long-term complications  2. HL  3. Overweight  PLAN:  1. Patient with longstanding, uncontrolled, type 2 diabetes, on oral antidiabetic regimen with metformin, to which I recommended to add a GLP-1 receptor agonist.  Previously, Dr. Everardo All suggested Rybelsus but she did not start it due to concerns about thyroid cancer.  Afterwards, after discussion about benefits and possible side effects of Rybelsus, I encouraged her to restart it.  However, unfortunately, her insurance declined it.  At last visit I suggested to start Ozempic.  Her HbA1c was higher, at 7.8%. -At today's visit, sugars are all at goal, improved.  She has not checked in the last 3 weeks as she forgot her meter at her mom's house, but she will ship it to her.  We discussed about checking daily, rotating check times.  For now, no need to change her regimen.  HbA1c is much better (see below). - I suggested to:  Patient Instructions  Please continue: - Metformin ER 500 mg 2x a day, with meals - Ozempic 0.5 mg weekly in a.m.  Please return in 4 months.   - we checked her HbA1c: 6.1% (lower) - advised to check sugars at different times of the day - 1x a day, rotating check times - advised for yearly eye exams >> she is UTD - will check annual labs today - return to clinic in 4 months  2. HL -Latest lipid panel was reviewed from 09/2021: All fractions at  goal: Lab Results  Component Value Date   CHOL 133 10/03/2021   HDL 39.90 10/03/2021   LDLCALC 67 10/03/2021   LDLDIRECT 94.0 03/11/2011   TRIG 127.0 10/03/2021   CHOLHDL 3 10/03/2021  -She was previously on Zocor 20 mg daily but she stopped this as she wanted to manage cholesterol levels by diet. -At last visit she wanted to wait until her a PE with PCP to check her lipids.  She did not have this yet and there is no appointment scheduled.  Will check her lipid panel today.  3.  Overweight -With truncal distribution -At last visit I recommended a GLP-1 receptor agonist.  She was able to start Ozempic.  She tolerates it well. -she does not have a history of pancreatitis or family history of medullary thyroid cancer   Component     Latest Ref Rng 09/24/2023  Hemoglobin A1C     4.0 - 5.6 % 6.1 !   Sodium     135 - 146 mmol/L 141   Potassium     3.5 - 5.3 mmol/L 4.8   Chloride     98 - 110 mmol/L 102   CO2     20 - 32 mmol/L 28   Glucose     65 - 99 mg/dL 94   BUN     7 - 25 mg/dL 14   Creatinine     9.60 - 1.03 mg/dL 4.54   Total Bilirubin     0.2 - 1.2 mg/dL 0.5   AST     10 - 35 U/L 32   ALT     6 - 29 U/L 47 (H)   Total Protein     6.1 - 8.1 g/dL 7.1  Calcium     8.6 - 10.4 mg/dL 9.7   Cholesterol     <409 mg/dL 811 (H)   Triglycerides     <150 mg/dL 914 (H)   HDL Cholesterol     > OR = 50 mg/dL 53   Total CHOL/HDL Ratio     <5.0 (calc) 4.1   Microalb, Ur     mg/dL 4.3   MICROALB/CREAT RATIO     <30 mg/g creat 41 (H)   eGFR     > OR = 60 mL/min/1.40m2 109   BUN/Creatinine Ratio     6 - 22 (calc) SEE NOTE:   Albumin MSPROF     3.6 - 5.1 g/dL 4.3   Globulin     1.9 - 3.7 g/dL (calc) 2.8   AG Ratio     1.0 - 2.5 (calc) 1.5   Alkaline phosphatase (APISO)     37 - 153 U/L 109   LDL Cholesterol (Calc)     mg/dL (calc) 782 (H)   Non-HDL Cholesterol (Calc)     <130 mg/dL (calc) 956 (H)   Creatinine, Urine     20 - 275 mg/dL 213    Patient's LDL is  approximately twice compared to before.  Triglycerides are slightly elevated.  At this point, I would suggest to restart a statin.  I will suggest pravastatin 40 mg daily. Her ALT is still elevated, not much change from before. Her urine proteins are slightly elevated.  I would like to recheck these at next visit.  Carlus Pavlov, MD PhD [facility] Endocrinology

## 2023-09-25 LAB — COMPLETE METABOLIC PANEL WITH GFR
AG Ratio: 1.5 (calc) (ref 1.0–2.5)
ALT: 47 U/L — ABNORMAL HIGH (ref 6–29)
AST: 32 U/L (ref 10–35)
Albumin: 4.3 g/dL (ref 3.6–5.1)
Alkaline phosphatase (APISO): 109 U/L (ref 37–153)
BUN: 14 mg/dL (ref 7–25)
CO2: 28 mmol/L (ref 20–32)
Calcium: 9.7 mg/dL (ref 8.6–10.4)
Chloride: 102 mmol/L (ref 98–110)
Creat: 0.52 mg/dL (ref 0.50–1.03)
Globulin: 2.8 g/dL (ref 1.9–3.7)
Glucose, Bld: 94 mg/dL (ref 65–99)
Potassium: 4.8 mmol/L (ref 3.5–5.3)
Sodium: 141 mmol/L (ref 135–146)
Total Bilirubin: 0.5 mg/dL (ref 0.2–1.2)
Total Protein: 7.1 g/dL (ref 6.1–8.1)
eGFR: 109 mL/min/{1.73_m2} (ref >=60)

## 2023-09-25 LAB — LIPID PANEL W/REFLEX DIRECT LDL
Cholesterol: 218 mg/dL — ABNORMAL HIGH (ref <200)
HDL: 53 mg/dL (ref >=50)
LDL Cholesterol (Calc): 132 mg/dL — ABNORMAL HIGH
Non-HDL Cholesterol (Calc): 165 mg/dL — ABNORMAL HIGH (ref <130)
Total CHOL/HDL Ratio: 4.1 (calc) (ref <5.0)
Triglycerides: 191 mg/dL — ABNORMAL HIGH (ref <150)

## 2023-09-25 LAB — MICROALBUMIN / CREATININE URINE RATIO
Creatinine, Urine: 106 mg/dL (ref 20–275)
Microalb Creat Ratio: 41 mg/g{creat} — ABNORMAL HIGH (ref <30)
Microalb, Ur: 4.3 mg/dL

## 2023-12-15 ENCOUNTER — Ambulatory Visit: Payer: Self-pay

## 2023-12-15 NOTE — Telephone Encounter (Addendum)
 Chief Complaint: Blood in urine Symptoms: blood in urine Frequency: since yesterday  Pertinent Negatives: Patient denies fever, back pain, abdominal pain, pain with urination Disposition: [] ED /[] Urgent Care (no appt availability in office) / [x] Appointment(In office/virtual)/ []  [facility]/ [] Home Care/ [] Refused Recommended Disposition /[] [place]/ []  Follow-up with PCP Additional Notes: Patient called in stating she noticed darker blood in her urine last night at 7 pm, and it has continued with each urination but lightened in color since then. Patient states it is not a lot of blood, and she is uncertain of the cause. She is not having any painw ith urination, back pain, fever. Patient states she has had a bladder infection and kidney stones in the past and is not experiencing any of these symptoms. Patient also denies any beets, red food dye or foods that would cause change in color of urine. Advised patient of 24 hour disposition to see provider but patient states she is out of town until Wednesday and is asking for appt on Wednesday. Appt made for further evaluation. Advised patient to call in if fever or back pain develops or symptoms worsen.    Copied from CRM [id number]. Topic: Clinical - Red Word Triage >> Dec 15, 2023 11:27 AM Gurney Maxin H wrote: [facility] that prompted transfer to Nurse Triage: Blood in urine Reason for Disposition  Blood in urine  (Exception: Could be normal menstrual bleeding.)  Answer Assessment - Initial Assessment Questions 1. COLOR of URINE: "Describe the color of the urine."  (e.g., tea-colored, pink, red, bloody) "Do you have blood clots in your urine?" (e.g., none, pea, grape, small coin)     Darker yesterday, lighter today 2. ONSET: "When did the bleeding start?"      Yesterday  3. EPISODES: "How many times has there been blood in the urine?" or "How many times today?"     Every time patient urinates since last night at 7 pm 4. PAIN  with URINATION: "Is there any pain with passing your urine?" If Yes, ask: "How bad is the pain?"  (Scale 1-10; or mild, moderate, severe)    - MILD: Complains slightly about urination hurting.    - MODERATE: Interferes with normal activities.      - SEVERE: Excruciating, unwilling or unable to urinate because of the pain.      No 5. FEVER: "Do you have a fever?" If Yes, ask: "What is your temperature, how was it measured, and when did it start?"     No 6. ASSOCIATED SYMPTOMS: "Are you passing urine more frequently than usual?"     No 7. OTHER SYMPTOMS: "Do you have any other symptoms?" (e.g., back/flank pain, abdomen pain, vomiting)     No  Protocols used: Urine - Blood In-A-AH

## 2023-12-16 NOTE — Progress Notes (Unsigned)
    Subjective:    Patient ID: Elizabeth Alvarado, female    DOB: 30-Nov-1965, 58 y.o.   MRN: [id number]      HPI Anaalicia is here for No chief complaint on file.   Blood in urine -      Medications and allergies reviewed with patient and updated if appropriate.  Current Outpatient Medications on File Prior to Visit  Medication Sig Dispense Refill   Calcium Carbonate-Vit D-Min (CALCIUM 1200 PO) Take by mouth.     irbesartan (AVAPRO) 150 MG tablet TAKE 1 TABLET DAILY 90 tablet 3   metFORMIN (GLUCOPHAGE-XR) 500 MG 24 hr tablet Take 1 tablet (500 mg total) by mouth 2 (two) times daily with a meal. 180 tablet 4   Multiple Vitamin (MULTI VITAMIN DAILY PO) Take by mouth.     OZEMPIC, 0.25 OR 0.5 MG/DOSE, 2 MG/3ML SOPN INJECT 0.5MG  INTO THE SKIN ONCE A WEEK 9 mL 3   No current facility-administered medications on file prior to visit.    Review of Systems     Objective:  There were no vitals filed for this visit. BP Readings from Last 3 Encounters:  09/24/23 130/70  05/21/23 122/80  12/04/22 136/80   Wt Readings from Last 3 Encounters:  09/24/23 192 lb (87.1 kg)  05/21/23 197 lb (89.4 kg)  12/04/22 192 lb (87.1 kg)   There is no height or weight on file to calculate BMI.    Physical Exam         Assessment & Plan:    See Problem List for Assessment and Plan of chronic medical problems.

## 2023-12-17 ENCOUNTER — Encounter: Payer: Self-pay | Admitting: Internal Medicine

## 2023-12-17 ENCOUNTER — Ambulatory Visit: Admitting: Internal Medicine

## 2023-12-17 VITALS — BP 130/74 | HR 73 | Temp 98.0°F | Ht 68.0 in | Wt 189.0 lb

## 2023-12-17 DIAGNOSIS — R310 Gross hematuria: Secondary | ICD-10-CM | POA: Diagnosis not present

## 2023-12-17 LAB — POC URINALSYSI DIPSTICK (AUTOMATED)
Bilirubin, UA: NEGATIVE
Glucose, UA: NEGATIVE
Ketones, UA: NEGATIVE
Leukocytes, UA: NEGATIVE
Nitrite, UA: POSITIVE
Protein, UA: NEGATIVE
Spec Grav, UA: 1.025 (ref 1.010–1.025)
Urobilinogen, UA: 0.2 U/dL
pH, UA: 6 (ref 5.0–8.0)

## 2023-12-17 MED ORDER — SULFAMETHOXAZOLE-TRIMETHOPRIM 800-160 MG PO TABS: 1.0000 | ORAL_TABLET | Freq: Two times a day (BID) | ORAL | 0 refills | Status: AC

## 2023-12-17 NOTE — Patient Instructions (Addendum)
 Take the antibiotic as prescribed.  Bactrim twice daily for 3 days.  Take tylenol if needed.     Increase your water intake.   Call if no improvement     Urinary Tract Infection, Adult A urinary tract infection (UTI) is an infection of any part of the urinary tract, which includes the kidneys, ureters, bladder, and urethra. These organs make, store, and get rid of urine in the body. UTI can be a bladder infection (cystitis) or kidney infection (pyelonephritis). What are the causes? This infection may be caused by fungi, viruses, or bacteria. Bacteria are the most common cause of UTIs. This condition can also be caused by repeated incomplete emptying of the bladder during urination. What increases the risk? This condition is more likely to develop if: You ignore your need to urinate or hold urine for long periods of time. You do not empty your bladder completely during urination. You wipe back to front after urinating or having a bowel movement, if you are female. You are uncircumcised, if you are female. You are constipated. You have a urinary catheter that stays in place (indwelling). You have a weak defense (immune) system. You have a medical condition that affects your bowels, kidneys, or bladder. You have diabetes. You take antibiotic medicines frequently or for long periods of time, and the antibiotics no longer work well against certain types of infections (antibiotic resistance). You take medicines that irritate your urinary tract. You are exposed to chemicals that irritate your urinary tract. You are female.  What are the signs or symptoms? Symptoms of this condition include: Fever. Frequent urination or passing small amounts of urine frequently. Needing to urinate urgently. Pain or burning with urination. Urine that smells bad or unusual. Cloudy urine. Pain in the lower abdomen or back. Trouble urinating. Blood in the urine. Vomiting or being less hungry than  normal. Diarrhea or abdominal pain. Vaginal discharge, if you are female.  How is this diagnosed? This condition is diagnosed with a medical history and physical exam. You will also need to provide a urine sample to test your urine. Other tests may be done, including: Blood tests. Sexually transmitted disease (STD) testing.  If you have had more than one UTI, a cystoscopy or imaging studies may be done to determine the cause of the infections. How is this treated? Treatment for this condition often includes a combination of two or more of the following: Antibiotic medicine. Other medicines to treat less common causes of UTI. Over-the-counter medicines to treat pain. Drinking enough water to stay hydrated.  Follow these instructions at home: Take over-the-counter and prescription medicines only as told by your health care provider. If you were prescribed an antibiotic, take it as told by your health care provider. Do not stop taking the antibiotic even if you start to feel better. Avoid alcohol, caffeine, tea, and carbonated beverages. They can irritate your bladder. Drink enough fluid to keep your urine clear or pale yellow. Keep all follow-up visits as told by your health care provider. This is important. Make sure to: Empty your bladder often and completely. Do not hold urine for long periods of time. Empty your bladder before and after sex. Wipe from front to back after a bowel movement if you are female. Use each tissue one time when you wipe. Contact a health care provider if: You have back pain. You have a fever. You feel nauseous or vomit. Your symptoms do not get better after 3 days. Your symptoms go away  and then return. Get help right away if: You have severe back pain or lower abdominal pain. You are vomiting and cannot keep down any medicines or water. This information is not intended to replace advice given to you by your health care provider. Make sure you discuss  any questions you have with your health care provider. Document Released: 06/05/2005 Document Revised: 02/07/2016 Document Reviewed: 07/17/2015 Elsevier Interactive Patient Education  [employer].

## 2023-12-18 LAB — POC URINALSYSI DIPSTICK (AUTOMATED)
Bilirubin, UA: NEGATIVE
Glucose, UA: NEGATIVE
Ketones, UA: NEGATIVE
Leukocytes, UA: NEGATIVE
Nitrite, UA: POSITIVE
Protein, UA: NEGATIVE
Spec Grav, UA: 1.025 (ref 1.010–1.025)
Urobilinogen, UA: 0.2 U/dL
pH, UA: 6 (ref 5.0–8.0)

## 2023-12-19 ENCOUNTER — Encounter: Payer: Self-pay | Admitting: Internal Medicine

## 2023-12-19 DIAGNOSIS — R310 Gross hematuria: Secondary | ICD-10-CM

## 2023-12-19 LAB — URINE CULTURE

## 2023-12-25 NOTE — Addendum Note (Signed)
 Addended by: Colene Dauphin on: 12/25/2023 07:27 AM   Modules accepted: Orders

## 2023-12-29 NOTE — Telephone Encounter (Signed)
 Copied from CRM [id number]. Topic: Clinical - Request for Lab/Test Order >> Dec 26, 2023 12:08 PM Luane Rumps D wrote: Reason for CRM: Janelle with [facility] calling in regards to Mrs. Oman's imaging order for renal abdomen with and without contrast, caller stated that she thinks the order was meant to be renal stone study abdomen/pelvis without contrast. Was wondering if the order could be changed prior to the patients appt for 5/5.

## 2023-12-30 NOTE — Addendum Note (Signed)
 Addended by: Colene Dauphin on: 12/30/2023 08:52 PM   Modules accepted: Orders

## 2024-01-06 ENCOUNTER — Telehealth: Payer: Self-pay | Admitting: Internal Medicine

## 2024-01-06 NOTE — Telephone Encounter (Signed)
 Copied from CRM [id number]. Topic: Referral - Prior Authorization Question >> Jan 06, 2024 10:12 AM DeAngela L wrote: Reason for CRM: Patient calling to inform the office that insurance company would like to know why the patient needs a CT Scan on 01/12/24 Patient states the insurance company sent a letter to Elizabeth Alvarado since her last appt was 12/17/23  to reply with why patient has ct scan schedule and more information needed to approve this appt / procedure  Patient call back number [phone] (M)

## 2024-01-12 ENCOUNTER — Encounter: Payer: Self-pay | Admitting: Internal Medicine

## 2024-01-12 ENCOUNTER — Ambulatory Visit
Admission: RE | Admit: 2024-01-12 | Discharge: 2024-01-12 | Disposition: A | Discharge status: Home / Self Care | Source: Ambulatory Visit | Attending: Internal Medicine

## 2024-01-12 DIAGNOSIS — R310 Gross hematuria: Secondary | ICD-10-CM

## 2024-01-14 ENCOUNTER — Telehealth: Payer: Self-pay

## 2024-01-14 ENCOUNTER — Ambulatory Visit: Payer: Managed Care, Other (non HMO) | Admitting: Internal Medicine

## 2024-01-14 NOTE — Telephone Encounter (Signed)
 Pt needs a PA on Ozempic . Currently out of medication.

## 2024-01-14 NOTE — Telephone Encounter (Signed)
 Pharmacy Patient Advocate Encounter   Received notification from Pt Calls Messages that prior authorization for Ozempic  is required/requested.   Insurance verification completed.   The patient is insured through [employer] .   Per test claim: PA required; PA submitted to above mentioned insurance via CoverMyMeds Key/confirmation #/EOC [id number] Status is pending

## 2024-01-15 NOTE — Telephone Encounter (Signed)
 Pharmacy Patient Advocate Encounter  Received notification from [employer] that Prior Authorization for Ozempic  (0.25 or 0.5 MG/DOSE) 2MG /3ML pen-injectors has been APPROVED from 01-15-2024 to 01-14-2025   PA #/Case ID/Reference #: Sheryn Doom

## 2024-02-03 ENCOUNTER — Other Ambulatory Visit: Payer: Self-pay | Admitting: Obstetrics and Gynecology

## 2024-02-03 DIAGNOSIS — Z1231 Encounter for screening mammogram for malignant neoplasm of breast: Secondary | ICD-10-CM

## 2024-03-24 ENCOUNTER — Encounter: Payer: Self-pay | Admitting: Internal Medicine

## 2024-03-24 ENCOUNTER — Ambulatory Visit: Admitting: Internal Medicine

## 2024-03-24 VITALS — BP 120/70 | HR 88 | Ht 68.0 in | Wt 192.6 lb

## 2024-03-24 DIAGNOSIS — Z7984 Long term (current) use of oral hypoglycemic drugs: Secondary | ICD-10-CM | POA: Diagnosis not present

## 2024-03-24 DIAGNOSIS — E7849 Other hyperlipidemia: Secondary | ICD-10-CM

## 2024-03-24 DIAGNOSIS — E1169 Type 2 diabetes mellitus with other specified complication: Secondary | ICD-10-CM | POA: Diagnosis not present

## 2024-03-24 DIAGNOSIS — E663 Overweight: Secondary | ICD-10-CM | POA: Diagnosis not present

## 2024-03-24 DIAGNOSIS — Z7985 Long-term (current) use of injectable non-insulin antidiabetic drugs: Secondary | ICD-10-CM

## 2024-03-24 LAB — POCT GLYCOSYLATED HEMOGLOBIN (HGB A1C): Hemoglobin A1C: 6.3 % — AB (ref 4.0–5.6)

## 2024-03-24 MED ORDER — OZEMPIC (0.25 OR 0.5 MG/DOSE) 2 MG/3ML ~~LOC~~ SOPN: 0.5000 mg | PEN_INJECTOR | SUBCUTANEOUS | 3 refills | Status: AC

## 2024-03-24 NOTE — Progress Notes (Signed)
 Patient ID: Elizabeth Alvarado, female   DOB: 05-Aug-1966, 58 y.o.   MRN: [id number]  HPI: Elizabeth Alvarado is a 58 y.o.-year-old female, returning for follow-up for DM2, dx in 2018, non-insulin-dependent, controlled, without long-term complications. Pt. previously saw Dr. Kassie, but last visit with me 6 months ago.  Interim history: No increased urination, blurry vision, nausea, chest pain. She was in vacation at the beach and developed a rash after coming back.  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  Reviewed HbA1c: Lab Results  Component Value Date   HGBA1C 6.1 (A) 09/24/2023   HGBA1C 7.8 (A) 05/21/2023   HGBA1C 6.9 (A) 12/04/2022   HGBA1C 6.1 (A) 12/25/2021   HGBA1C 7.0 (H) 10/03/2021   HGBA1C 6.9 (A) 03/01/2021   HGBA1C 6.5 08/31/2020   HGBA1C 6.0 (A) 04/25/2020   HGBA1C 6.0 07/29/2019   HGBA1C 5.8 (A) 02/03/2019   Pt is on a regimen of: - Metformin  ER 500 mg 2x a day, with meals - Ozempic  0.25 >> 0.5 mg weekly - started 05/2023 We tried Rybelsus  3 >> 7 mg before b'fast - rec'd 11/2022 - was not able to start 2/2 lack of insurance coverage. She was on Janumet  before but developed vaginitis so this was stopped at last visit with Dr. Kassie in 12/2021.   Pt checks her sugars 1x a day and they are: - am: 99-115, 120 >> 110-120 >> 100-110 >> 98-130 - 2h after b'fast: n/c - before lunch: n/c >> 110 >> 98-110 - 2h after lunch: n/c >>  up to 120 >> 110-120 - before dinner: n/c >> 110 >> 100-110 - 2h after dinner: n/c - bedtime: 100-115 >> 120-130, 150s >> 99 >> 110-130 - nighttime: n/c Lowest sugar was 99 >> 110 >> 99; ? hypoglycemia awareness.  Highest sugar was 120 >> 150s >> 120 >> 130.  Glucometer: AccuChek  Pt's meals are: - Breakfast: pack or crackers or hard boiled eggs - Lunch: salad or steak, shrimp - Dinner: occas. Fast food - Snacks: pack of crackers  - no CKD, last BUN/creatinine:  Lab Results   Component Value Date   BUN 14 09/24/2023   BUN 14 10/03/2021   CREATININE 0.52 09/24/2023   CREATININE 0.68 10/03/2021   Lab Results  Component Value Date   MICRALBCREAT 41 (H) 09/24/2023  Prev. on Avapro  150 mg daily - stopped.  -+ HL; last set of lipids: Lab Results  Component Value Date   CHOL 218 (H) 09/24/2023   HDL 53 09/24/2023   LDLCALC 132 (H) 09/24/2023   LDLDIRECT 94.0 03/11/2011   TRIG 191 (H) 09/24/2023   CHOLHDL 4.1 09/24/2023  Prev. on Zocor  20 mg daily -came off to try to work on her diet.  Since last visit, she started Red Yeast Rice: 2 tabs at night, Garlique 2 tabs in am.  - last eye exam was 05/06/2023. No DR.  - no numbness and tingling in her feet.  Last foot exam 12/04/2022.  History of OSA, kidney stones, UTIs, GERD.  She had a positive colorectal cancer screening. She declined gastric bypass in the past per review of Dr. Laymond note. She had an Es stretching 2023.   ROS: + see HPI  Past Medical History:  Diagnosis Date   Blood transfusion without reported diagnosis 1984   Diabetes mellitus without complication (HCC)    DILATION AND CURETTAGE, HX OF 08/25/2007   GERD (gastroesophageal reflux disease)    Hyperlipidemia    Hypertension  RENAL CALCULUS, HX OF 08/25/2007   Sleep apnea    Ureteral stone 01/09/2011   UTI'S, HX OF 08/25/2007   Past Surgical History:  Procedure Laterality Date   BACK SURGERY     BREAST BIOPSY Left    CERVIX LESION DESTRUCTION  April '12   minor abnormal PAP; hot ballon application   DILATION AND CURETTAGE OF UTERUS     hx of   LITHOTRIPSY     Social History   Socioeconomic History   Marital status: Married    Spouse name: Not on file   Number of children: 1   Years of education: 14   Highest education level: Associate degree: occupational, Scientist, product/process development, or vocational program  Occupational History   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    Employer: [employer]    Comment: [employer],  [place] [employer] ac[employer].  Tobacco Use   Smoking status: Never   Smokeless tobacco: Never  Vaping Use   Vaping status: Never Used  Substance and Sexual Activity   Alcohol use: Yes    Alcohol/week: 2.0 standard drinks of alcohol    Types: 2 Standard drinks or equivalent per week    Comment: rare drink of wine or beer once a month   Drug use: No   Sexual activity: Yes    Partners: Male    Comment: Monogamous relationship with husband.  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 Life is good   Social Drivers of Corporate investment banker Strain: Low Risk  (12/15/2023)   Overall Financial Resource Strain (CARDIA)    Difficulty of Paying Living Expenses: Not very hard  Food Insecurity: No Food Insecurity (12/15/2023)   Hunger Vital Sign    Worried About Running Out of Food in the Last Year: Never true    Ran Out of Food in the Last Year: Never true  Transportation Needs: No Transportation Needs (12/15/2023)   PRAPARE - Administrator, Civil Service (Medical): No    Lack of Transportation (Non-Medical): No  Physical Activity: Insufficiently Active (12/15/2023)   Exercise Vital Sign    Days of Exercise per Week: 2 days    Minutes of Exercise per Session: 40 min  Stress: No Stress Concern Present (12/15/2023)   [employer] of Occupational Health - Occupational Stress Questionnaire    Feeling of Stress : Only a little  Social Connections: Moderately Isolated (12/15/2023)   Social Connection and Isolation Panel    Frequency of Communication with Friends and Family: More than three times a week    Frequency of Social Gatherings with Friends and Family: Twice a week    Attends Religious Services: Never    Database administrator or Organizations: No    Attends Engineer, structural: Not on file    Marital Status:  Married  Catering manager Violence: Not on file   Current Outpatient Medications on File Prior to Visit  Medication Sig Dispense Refill   Calcium Carbonate-Vit D-Min (CALCIUM 1200 PO) Take by mouth.     metFORMIN  (GLUCOPHAGE -XR) 500 MG 24 hr tablet Take 1 tablet (500 mg total) by mouth 2 (two) times daily with a meal. 180 tablet 4   Multiple Vitamin (MULTI VITAMIN DAILY PO) Take by mouth.     OZEMPIC , 0.25 OR 0.5 MG/DOSE, 2 MG/3ML  SOPN INJECT 0.5MG  INTO THE SKIN ONCE A WEEK 9 mL 3   No current facility-administered medications on file prior to visit.   No Known Allergies Family History  Problem Relation Age of Onset   Colon polyps Mother    Cancer Mother        breast cancer, 1943   Hypertension Mother    Colon polyps Father    Cancer Father        prostate cancer, 1942   Hyperlipidemia Father    Diabetes Neg Hx    Colon cancer Neg Hx    Esophageal cancer Neg Hx    Stomach cancer Neg Hx    Rectal cancer Neg Hx    PE: BP 120/70   Pulse 88   Ht 5' 8 (1.727 m)   Wt 192 lb 9.6 oz (87.4 kg)   LMP 10/14/2017   SpO2 96%   BMI 29.28 kg/m  Wt Readings from Last 3 Encounters:  03/24/24 192 lb 9.6 oz (87.4 kg)  12/17/23 189 lb (85.7 kg)  09/24/23 192 lb (87.1 kg)   Constitutional: overweight, in NAD Eyes:  EOMI, no exophthalmos ENT: no neck masses, no cervical lymphadenopathy Cardiovascular: RRR, No MRG Respiratory: CTA B Musculoskeletal: no deformities Skin:no rashes Neurological: no tremor with outstretched hands Diabetic Foot Exam - Simple   Simple Foot Form Diabetic Foot exam was performed with the following findings: Yes 03/24/2024 11:43 AM  Visual Inspection No deformities, no ulcerations, no other skin breakdown bilaterally: Yes Sensation Testing Intact to touch and monofilament testing bilaterally: Yes Pulse Check Posterior Tibialis and Dorsalis pulse intact bilaterally: Yes Comments + erythematous desquamative rash on the lateral side of L foot     ASSESSMENT: 1. DM2, non-insulin-dependent, controlled, without long-term complications  2. HL  3. Overweight  PLAN:  1. Patient with longstanding, controlled, type 2 diabetes, on oral antidiabetic regimen with metformin , along with weekly GLP-1 receptor agonist, started before last visit.  HbA1c improved at that time to 6.1%.  Sugars were also at goal, improved.  We discussed about continuing the same regimen.  She was not checking blood sugars after she misplaced her meter and I advised her to start. - At today's visit, she just returned from vacation and she feels that this may be the reason why some of her blood sugars are higher, but still only up to 130 in am.  She checks blood sugars at different times of the day and sugars remain controlled reportedly. No need to change her regimen for now as she is planning to get back to the previous diet. I refilled her Ozempic . - I suggested to:  Patient Instructions  Please continue: - Metformin  ER 500 mg 2x a day, with meals - Ozempic  0.5 mg weekly in a.m.  Please return in 4 months.   - we checked her HbA1c: 6.3% (slightly higher) - advised to check sugars at different times of the day - 1x a day, rotating check times - advised for yearly eye exams >> she is UTD - at last visit, her ACR was elevated.  Will recheck this today and may need to restart Avapro  if not improving - return to clinic in 4 months  2. HL - Latest lipid panel showed an elevated LDL and triglycerides: Lab Results  Component Value Date   CHOL 218 (H) 09/24/2023   HDL 53 09/24/2023   LDLCALC 132 (H) 09/24/2023   LDLDIRECT 94.0 03/11/2011   TRIG 191 (H) 09/24/2023   CHOLHDL 4.1 09/24/2023  -  She was previously on Zocor  20 mg daily but she stopped this as she wanted to manage cholesterol levels by diet.  At last visit, I suggested to start pravastatin 40 mg daily.  She did not start this, but she does mention that she started red yeast rice 2 capsule daily.  I  advised her to make sure that she is taking 2 capsules of 600 mg daily.  She is also taking Garlique.  She declines a lipid panel rechecked today, as she would want this checked by PCP - At last visit, an ALT was also elevated.  We discussed about also having this rechecked at the next visit with PCP, which she is planning to schedule.   3.  Overweight -With truncal distribution -she does not have a history of pancreatitis or family history of medullary thyroid  cancer  - Will continue Ozempic  for now.  She tolerated well. - weight is approximately stable since last visit.  Lela Fendt, MD PhD [facility] Endocrinology

## 2024-03-24 NOTE — Patient Instructions (Signed)
 Please continue: - Metformin  ER 500 mg 2x a day, with meals - Ozempic  0.5 mg weekly in a.m.  Please return in 4 months.

## 2024-03-25 ENCOUNTER — Ambulatory Visit: Payer: Self-pay | Admitting: Internal Medicine

## 2024-03-25 LAB — MICROALBUMIN / CREATININE URINE RATIO
Creatinine, Urine: 144 mg/dL (ref 20–275)
Microalb Creat Ratio: 10 mg/g{creat} (ref <30)
Microalb, Ur: 1.4 mg/dL

## 2024-03-31 ENCOUNTER — Ambulatory Visit
Admission: RE | Admit: 2024-03-31 | Discharge: 2024-03-31 | Disposition: A | Discharge status: Home / Self Care | Source: Ambulatory Visit | Attending: Obstetrics and Gynecology | Admitting: Obstetrics and Gynecology

## 2024-03-31 DIAGNOSIS — Z1231 Encounter for screening mammogram for malignant neoplasm of breast: Secondary | ICD-10-CM

## 2024-07-28 ENCOUNTER — Ambulatory Visit: Admitting: Internal Medicine

## 2024-08-11 ENCOUNTER — Encounter: Payer: Self-pay | Admitting: Internal Medicine

## 2024-08-11 ENCOUNTER — Ambulatory Visit: Admitting: Internal Medicine

## 2024-08-11 VITALS — BP 120/70 | HR 85 | Ht 68.0 in | Wt 193.4 lb

## 2024-08-11 DIAGNOSIS — E1169 Type 2 diabetes mellitus with other specified complication: Secondary | ICD-10-CM

## 2024-08-11 DIAGNOSIS — Z7985 Long-term (current) use of injectable non-insulin antidiabetic drugs: Secondary | ICD-10-CM

## 2024-08-11 DIAGNOSIS — E7849 Other hyperlipidemia: Secondary | ICD-10-CM

## 2024-08-11 DIAGNOSIS — Z7984 Long term (current) use of oral hypoglycemic drugs: Secondary | ICD-10-CM

## 2024-08-11 DIAGNOSIS — E663 Overweight: Secondary | ICD-10-CM

## 2024-08-11 LAB — POCT GLYCOSYLATED HEMOGLOBIN (HGB A1C): Hemoglobin A1C: 5.9 % — AB (ref 4.0–5.6)

## 2024-08-11 MED ORDER — METFORMIN HCL ER 500 MG PO TB24: 500.0000 mg | ORAL_TABLET | Freq: Two times a day (BID) | ORAL | 4 refills | Status: AC

## 2024-08-11 NOTE — Patient Instructions (Signed)
 Please continue: - Metformin  ER 500 mg 2x a day, with meals - Ozempic  0.5 mg weekly in a.m.  Please return in 4-6 months.

## 2024-08-11 NOTE — Progress Notes (Signed)
 Patient ID: Elizabeth Alvarado, female   DOB: 03-25-1966, 58 y.o.   MRN: [id number]  HPI: Elizabeth Alvarado is a 58 y.o.-year-old female, returning for follow-up for DM2, dx in 2018, non-insulin-dependent, controlled, without long-term complications. Pt. previously saw Dr. Kassie, but last visit with me 5 months ago.  Interim history: No increased urination, blurry vision, nausea, chest pain.  Reviewed HbA1c: Lab Results  Component Value Date   HGBA1C 6.3 (A) 03/24/2024   HGBA1C 6.1 (A) 09/24/2023   HGBA1C 7.8 (A) 05/21/2023   HGBA1C 6.9 (A) 12/04/2022   HGBA1C 6.1 (A) 12/25/2021   HGBA1C 7.0 (H) 10/03/2021   HGBA1C 6.9 (A) 03/01/2021   HGBA1C 6.5 08/31/2020   HGBA1C 6.0 (A) 04/25/2020   HGBA1C 6.0 07/29/2019   Pt is on a regimen of: - Metformin  ER 500 mg 2x a day, with meals - Ozempic  0.25 >> 0.5 mg weekly - started 05/2023 We tried Rybelsus  3 >> 7 mg before b'fast - rec'd 11/2022 - was not able to start 2/2 lack of insurance coverage. She was on Janumet  before but developed vaginitis so this was stopped at last visit with Dr. Kassie in 12/2021.   Pt checks her sugars 1x a day and they are: - am: 110-120 >> 100-110 >> 98-130 - 2h after b'fast: n/c - before lunch: n/c >> 110 >> 98-110 - 2h after lunch: n/c >>  up to 120 >> 110-120 - before dinner: n/c >> 110 >> 100-110 - 2h after dinner: n/c - bedtime: 120-130, 150s >> 99 >> 110-130 - nighttime: n/c Lowest sugar was 99 >> 110 >> 99; ? hypoglycemia awareness.  Highest sugar was 120 >> 130.  Glucometer: AccuChek  Pt's meals are: - Breakfast: pack or crackers or hard boiled eggs - Lunch: salad or steak, shrimp - Dinner: occas. Fast food - Snacks: pack of crackers  - no CKD, last BUN/creatinine:  Lab Results  Component Value Date   BUN 14 09/24/2023   BUN 14 10/03/2021   CREATININE 0.52 09/24/2023   CREATININE 0.68 10/03/2021   Lab Results  Component Value Date   MICRALBCREAT 10 03/24/2024    MICRALBCREAT 41 (H) 09/24/2023  Prev. on Avapro  150 mg daily - stopped.  -+ HL; last set of lipids: Lab Results  Component Value Date   CHOL 218 (H) 09/24/2023   HDL 53 09/24/2023   LDLCALC 132 (H) 09/24/2023   LDLDIRECT 94.0 03/11/2011   TRIG 191 (H) 09/24/2023   CHOLHDL 4.1 09/24/2023  Prev. on Zocor  20 mg daily -came off to try to work on her diet.  Since last visit, she started Red Yeast Rice: 2 tabs at night, Garlique 2 tabs in am.  - last eye exam was 04/2024. No DR reportedly. Dr. Lorrene Devonshire - [facility].  - no numbness and tingling in her feet.  Last foot exam 03/24/2024.  History of OSA, kidney stones, UTIs, GERD.  She had a positive colorectal cancer screening. She declined gastric bypass in the past per review of Dr. Laymond note. She had an Es stretching 2023.   ROS: + see HPI  Past Medical History:  Diagnosis Date   Blood transfusion without reported diagnosis 1984   Diabetes mellitus without complication (HCC)    DILATION AND CURETTAGE, HX OF 08/25/2007   GERD (gastroesophageal reflux disease)    Hyperlipidemia    Hypertension    RENAL CALCULUS, HX OF 08/25/2007   Sleep apnea    Ureteral stone 01/09/2011   UTI'S, HX OF 08/25/2007  Past Surgical History:  Procedure Laterality Date   BACK SURGERY     BREAST BIOPSY Left    CERVIX LESION DESTRUCTION  April '12   minor abnormal PAP; hot ballon application   DILATION AND CURETTAGE OF UTERUS     hx of   LITHOTRIPSY     Social History   Socioeconomic History   Marital status: Married    Spouse name: Not on file   Number of children: 1   Years of education: 14   Highest education level: Associate degree: occupational, scientist, product/process development, or vocational program  Occupational History   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    Employer: [employer]    Comment: [employer] [place] [employer].  Tobacco Use   Smoking status: Never   Smokeless tobacco: Never  Vaping Use   Vaping status: Never Used   Substance and Sexual Activity   Alcohol use: Yes    Alcohol/week: 2.0 standard drinks of alcohol    Types: 2 Standard drinks or equivalent per week    Comment: rare drink of wine or beer once a month   Drug use: No   Sexual activity: Yes    Partners: Male    Comment: Monogamous relationship with husband.  Other Topics Concern   Not on file  Social History Narrative   [employer], [employer] - 2 year accounting degree. Married '95. 1 son '01.Marriage is good.    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 Life is good   Social Drivers of Corporate Investment Banker Strain: Low Risk  (12/15/2023)   Overall Financial Resource Strain (CARDIA)    Difficulty of Paying Living Expenses: Not very hard  Food Insecurity: No Food Insecurity (12/15/2023)   Hunger Vital Sign    Worried About Running Out of Food in the Last Year: Never true    Ran Out of Food in the Last Year: Never true  Transportation Needs: No Transportation Needs (12/15/2023)   PRAPARE - Administrator, Civil Service (Medical): No    Lack of Transportation (Non-Medical): No  Physical Activity: Insufficiently Active (12/15/2023)   Exercise Vital Sign    Days of Exercise per Week: 2 days    Minutes of Exercise per Session: 40 min  Stress: No Stress Concern Present (12/15/2023)   [employer] of Occupational Health - Occupational Stress Questionnaire    Feeling of Stress : Only a little  Social Connections: Moderately Isolated (12/15/2023)   Social Connection and Isolation Panel    Frequency of Communication with Friends and Family: More than three times a week    Frequency of Social Gatherings with Friends and Family: Twice a week    Attends Religious Services: Never    Database Administrator or Organizations: No    Attends Engineer, Structural: Not on file    Marital Status: Married  Catering Manager Violence: Not on file   Current Outpatient Medications on File Prior to Visit  Medication Sig Dispense  Refill   Calcium Carbonate-Vit D-Min (CALCIUM 1200 PO) Take by mouth.     metFORMIN  (GLUCOPHAGE -XR) 500 MG 24 hr tablet Take 1 tablet (500 mg total) by mouth 2 (two) times daily with a meal. 180 tablet 4   Multiple Vitamin (MULTI VITAMIN DAILY PO) Take by mouth.     Semaglutide ,0.25 or 0.5MG /DOS, (OZEMPIC , 0.25 OR 0.5 MG/DOSE,) 2 MG/3ML SOPN Inject 0.5 mg into the skin once a week. 9 mL 3   No current facility-administered medications on  file prior to visit.   No Known Allergies Family History  Problem Relation Age of Onset   Breast cancer Mother 104   Colon polyps Mother    Hypertension Mother    Colon polyps Father    Hyperlipidemia Father    Prostate cancer Father        38   Breast cancer Maternal Aunt 104 - 59   Diabetes Neg Hx    Colon cancer Neg Hx    Esophageal cancer Neg Hx    Stomach cancer Neg Hx    Rectal cancer Neg Hx    PE: BP 120/70   Pulse 85   Ht 5' 8 (1.727 m)   Wt 193 lb 6.4 oz (87.7 kg)   LMP 10/14/2017   SpO2 99%   BMI 29.41 kg/m  Wt Readings from Last 3 Encounters:  08/11/24 193 lb 6.4 oz (87.7 kg)  03/24/24 192 lb 9.6 oz (87.4 kg)  12/17/23 189 lb (85.7 kg)   Constitutional: overweight, in NAD Eyes:  EOMI, no exophthalmos ENT: no neck masses, no cervical lymphadenopathy Cardiovascular: RRR, No MRG Respiratory: CTA B Musculoskeletal: no deformities Skin:no rashes Neurological: + tremor with outstretched hands (R>L)  ASSESSMENT: 1. DM2, non-insulin-dependent, controlled, without long-term complications  2. HL  3. Overweight  PLAN:  1. Patient with longstanding, fairly well-controlled type 2 diabetes, on metformin  and weekly GLP-1 receptor agonist, with an HbA1c of 6.3% at last visit, slightly higher, but still at goal.  At that time, she just returned from vacation and some of her blood sugars were higher, but still only up to 130 in the morning.  She was planning to return to the previous diet so we did not change the regimen.  I refilled  her Ozempic . - This visit, HbA1c is even lower than before and sugars remain controlled.  We can continue the same regimen for now.  I refilled her metformin .  We discussed about paying attention and avoiding not only concentrated sweets, but also some of the starches. - I suggested to:  Patient Instructions  Please continue: - Metformin  ER 500 mg 2x a day, with meals - Ozempic  0.5 mg weekly in a.m.  Please return in 4-6 months.   - we checked her HbA1c: 5.9% (lower) - advised to check sugars at different times of the day - 1x a day, rotating check times - advised for yearly eye exams >> she is UTD - return to clinic in 4-6 months  2. HL - Latest lipid panel reviewed from 09/2023: LDL above target, triglycerides also elevated: Lab Results  Component Value Date   CHOL 218 (H) 09/24/2023   HDL 53 09/24/2023   LDLCALC 132 (H) 09/24/2023   LDLDIRECT 94.0 03/11/2011   TRIG 191 (H) 09/24/2023   CHOLHDL 4.1 09/24/2023  -She was previously on Zocor  20 mg daily but she stopped this as she wanted to manage cholesterol levels by diet.  I previously suggested pravastatin 40 mg daily.  She did not start this, but she she mentioned that she started red yeast rice 2 capsule daily.  I advised her to make sure that she is taking 2 capsules of 600 mg daily.  She is also taking Garlique.  She declined a lipid panel at last visit, as she wanted this checked by PCP   3.  Overweight -With truncal distribution - she does not have a history of pancreatitis or family history of medullary thyroid  cancer  - Continue Ozempic  for now. - Weight was  stable at last visit, now gained 1 lb  Lela Fendt, MD PhD [facility]

## 2024-10-07 ENCOUNTER — Ambulatory Visit: Payer: Self-pay | Admitting: *Deleted

## 2024-10-07 NOTE — Telephone Encounter (Signed)
 FYI Only or Action Required?: FYI only for provider: appointment scheduled on 2/4.  Patient was last seen in primary care on 12/17/2023 by Geofm Glade PARAS, MD.  Called Nurse Triage reporting Hematuria (Back pain).  Symptoms began a week ago.  Interventions attempted: OTC medications: ibuprofen once.  Symptoms are: unchanged.  Triage Disposition: See Physician Within 24 Hours  Patient/caregiver understands and will follow disposition?: No, refuses disposition   Message from [place] [facility] sent at 10/07/2024 10:27 AM EST  Reason for Triage: Kidney stones - pain in back and blood in urine. Patient would like a physical as well.   Reason for Disposition  Side (flank) or back pain present  Answer Assessment - Initial Assessment Questions Patient has hx of kidney stones and feels she may have another- denies pain other than lower back, frequency/urgency, fever. Offered appointment tomorrow- but patient states she will be out of town- she request appointment next Wednesday- patient has been scheduled with clear instructions to be seen if she should have change in symptoms    1. COLOR of URINE: Describe the color of the urine.  (e.g., tea-colored, pink, red, bloody) Do you have blood clots in your urine? (e.g., none, pea, grape, small coin)     Dark with blood present- not red or pink, no clots 2. ONSET: When did the bleeding start?      1 week ago 3. EPISODES: How many times has there been blood in the urine? or How many times today?     Not every time- once today- probably 6 times out of 1 week 4. PAIN with URINATION: Is there any pain with passing your urine? If Yes, ask: How bad is the pain?  (Scale 1-10; or mild, moderate, severe)     no 5. FEVER: Do you have a fever? If Yes, ask: What is your temperature, how was it measured, and when did it start?     no 6. ASSOCIATED SYMPTOMS: Are you passing urine more frequently than usual?     no 7. OTHER SYMPTOMS: Do you have  any other symptoms? (e.g., back/flank pain, abdomen pain, vomiting)     Back pain- power back  Protocols used: Urine - Blood In-A-AH

## 2024-10-07 NOTE — Telephone Encounter (Signed)
 Received

## 2024-10-13 ENCOUNTER — Ambulatory Visit: Admitting: Internal Medicine

## 2024-10-20 ENCOUNTER — Ambulatory Visit: Admitting: Internal Medicine
